# Patient Record
Sex: Female | Born: 1970
Health system: Southern US, Community
[De-identification: ages and names within clinical notes are randomized; demographics above are authoritative.]

## PROBLEM LIST (undated history)

## (undated) ENCOUNTER — Inpatient Hospital Stay (HOSPITAL_COMMUNITY): Payer: Self-pay

## (undated) DIAGNOSIS — K222 Esophageal obstruction: Secondary | ICD-10-CM

## (undated) DIAGNOSIS — T4145XA Adverse effect of unspecified anesthetic, initial encounter: Secondary | ICD-10-CM

## (undated) DIAGNOSIS — T8859XA Other complications of anesthesia, initial encounter: Secondary | ICD-10-CM

## (undated) HISTORY — PX: ESOPHAGEAL DILATION: SHX303

---

## 1999-01-19 ENCOUNTER — Other Ambulatory Visit: Admission: RE | Admit: 1999-01-19 | Discharge: 1999-01-19 | Payer: Self-pay | Admitting: Obstetrics & Gynecology

## 2000-01-22 ENCOUNTER — Other Ambulatory Visit: Admission: RE | Admit: 2000-01-22 | Discharge: 2000-01-22 | Payer: Self-pay | Admitting: Obstetrics & Gynecology

## 2000-05-11 ENCOUNTER — Other Ambulatory Visit: Admission: RE | Admit: 2000-05-11 | Discharge: 2000-05-11 | Payer: Self-pay | Admitting: Obstetrics & Gynecology

## 2001-10-19 ENCOUNTER — Other Ambulatory Visit: Admission: RE | Admit: 2001-10-19 | Discharge: 2001-10-19 | Payer: Self-pay | Admitting: Gynecology

## 2002-11-09 ENCOUNTER — Other Ambulatory Visit: Admission: RE | Admit: 2002-11-09 | Discharge: 2002-11-09 | Payer: Self-pay | Admitting: Gynecology

## 2004-05-01 ENCOUNTER — Other Ambulatory Visit: Admission: RE | Admit: 2004-05-01 | Discharge: 2004-05-01 | Payer: Self-pay | Admitting: Obstetrics and Gynecology

## 2004-12-08 ENCOUNTER — Other Ambulatory Visit: Admission: RE | Admit: 2004-12-08 | Discharge: 2004-12-08 | Payer: Self-pay | Admitting: Gynecology

## 2007-01-03 ENCOUNTER — Ambulatory Visit: Payer: Self-pay | Admitting: Gastroenterology

## 2007-01-03 LAB — CONVERTED CEMR LAB
ALT: 14 units/L (ref 0–40)
AST: 20 units/L (ref 0–37)
Albumin: 3.8 g/dL (ref 3.5–5.2)
Alkaline Phosphatase: 48 units/L (ref 39–117)
BUN: 8 mg/dL (ref 6–23)
Basophils Absolute: 0 10*3/uL (ref 0.0–0.1)
Basophils Relative: 0.6 % (ref 0.0–1.0)
Bilirubin, Direct: 0.1 mg/dL (ref 0.0–0.3)
Calcium: 8.9 mg/dL (ref 8.4–10.5)
Chloride: 110 meq/L (ref 96–112)
Folate: 9.9 ng/mL
GFR calc Af Amer: 124 mL/min
GFR calc non Af Amer: 102 mL/min
HCT: 38.4 % (ref 36.0–46.0)
Hemoglobin: 13 g/dL (ref 12.0–15.0)
MCHC: 33.9 g/dL (ref 30.0–36.0)
Monocytes Relative: 5.5 % (ref 3.0–11.0)
RBC: 4.46 M/uL (ref 3.87–5.11)
RDW: 13.2 % (ref 11.5–14.6)
Tissue Transglutaminase Ab, IgA: <3 units (ref <5)
Vitamin B-12: 315 pg/mL (ref 211–911)

## 2007-01-04 ENCOUNTER — Ambulatory Visit: Payer: Self-pay | Admitting: Gastroenterology

## 2007-06-21 ENCOUNTER — Inpatient Hospital Stay (HOSPITAL_COMMUNITY): Admission: AD | Admit: 2007-06-21 | Discharge: 2007-06-21 | Payer: Self-pay | Admitting: Obstetrics and Gynecology

## 2008-08-15 ENCOUNTER — Inpatient Hospital Stay (HOSPITAL_COMMUNITY): Admission: AD | Admit: 2008-08-15 | Discharge: 2008-08-17 | Payer: Self-pay | Admitting: Obstetrics and Gynecology

## 2009-09-27 LAB — HM MAMMOGRAPHY

## 2010-09-19 ENCOUNTER — Inpatient Hospital Stay (HOSPITAL_COMMUNITY)
Admission: AD | Admit: 2010-09-19 | Discharge: 2010-09-19 | Payer: Self-pay | Source: Home / Self Care | Attending: Obstetrics and Gynecology | Admitting: Obstetrics and Gynecology
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
## 2011-04-19 ENCOUNTER — Inpatient Hospital Stay (HOSPITAL_COMMUNITY)
Admission: RE | Admit: 2011-04-19 | Discharge: 2011-04-21 | DRG: 371 | Disposition: A | Discharge status: Home / Self Care | Payer: BC Managed Care – PPO | Source: Ambulatory Visit | Attending: Obstetrics and Gynecology | Admitting: Obstetrics and Gynecology

## 2011-04-19 ENCOUNTER — Encounter (HOSPITAL_COMMUNITY): Payer: Self-pay | Admitting: Anesthesiology

## 2011-04-19 ENCOUNTER — Encounter (HOSPITAL_COMMUNITY): Payer: Self-pay

## 2011-04-19 ENCOUNTER — Encounter (HOSPITAL_COMMUNITY)
Admission: RE | Disposition: A | Discharge status: Home / Self Care | Payer: Self-pay | Source: Ambulatory Visit | Attending: Obstetrics and Gynecology

## 2011-04-19 ENCOUNTER — Inpatient Hospital Stay (HOSPITAL_COMMUNITY): Payer: BC Managed Care – PPO | Admitting: Anesthesiology

## 2011-04-19 ENCOUNTER — Other Ambulatory Visit: Payer: Self-pay | Admitting: Obstetrics and Gynecology

## 2011-04-19 DIAGNOSIS — O09529 Supervision of elderly multigravida, unspecified trimester: Secondary | ICD-10-CM | POA: Diagnosis present

## 2011-04-19 DIAGNOSIS — O328XX0 Maternal care for other malpresentation of fetus, not applicable or unspecified: Principal | ICD-10-CM | POA: Diagnosis present

## 2011-04-19 DIAGNOSIS — Z3490 Encounter for supervision of normal pregnancy, unspecified, unspecified trimester: Secondary | ICD-10-CM

## 2011-04-19 HISTORY — DX: Other complications of anesthesia, initial encounter: T88.59XA

## 2011-04-19 HISTORY — DX: Adverse effect of unspecified anesthetic, initial encounter: T41.45XA

## 2011-04-19 HISTORY — DX: Esophageal obstruction: K22.2

## 2011-04-19 LAB — CBC
Hemoglobin: 12.8 g/dL (ref 12.0–15.0)
MCH: 28.8 pg (ref 26.0–34.0)
Platelets: 216 10*3/uL (ref 150–400)
RBC: 4.44 MIL/uL (ref 3.87–5.11)
WBC: 13 10*3/uL — ABNORMAL HIGH (ref 4.0–10.5)

## 2011-04-19 LAB — ABO/RH: RH Type: NEGATIVE

## 2011-04-19 LAB — HIV ANTIBODY (ROUTINE TESTING W REFLEX): HIV: NONREACTIVE

## 2011-04-19 LAB — TYPE AND SCREEN: Antibody Screen: POSITIVE

## 2011-04-19 LAB — RPR: RPR: NONREACTIVE

## 2011-04-19 LAB — STREP B DNA PROBE: GBS: NEGATIVE

## 2011-04-19 SURGERY — Surgical Case
Anesthesia: Regional | Wound class: Clean Contaminated

## 2011-04-19 MED ORDER — FENTANYL CITRATE 0.05 MG/ML IJ SOLN
INTRAMUSCULAR | Status: AC
  Filled 2011-04-19: qty 2

## 2011-04-19 MED ORDER — PHENYLEPHRINE 40 MCG/ML (10ML) SYRINGE FOR IV PUSH (FOR BLOOD PRESSURE SUPPORT)
80.0000 ug | PREFILLED_SYRINGE | INTRAVENOUS | Status: DC | PRN
  Filled 2011-04-19 (×2): qty 5

## 2011-04-19 MED ORDER — METOCLOPRAMIDE HCL 5 MG/ML IJ SOLN: 10.0000 mg | Freq: Three times a day (TID) | INTRAMUSCULAR | Status: DC | PRN

## 2011-04-19 MED ORDER — ONDANSETRON HCL 4 MG/2ML IJ SOLN: 4.0000 mg | Freq: Four times a day (QID) | INTRAMUSCULAR | Status: DC | PRN

## 2011-04-19 MED ORDER — CEFAZOLIN SODIUM 1-5 GM-% IV SOLN
INTRAVENOUS | Status: AC
  Filled 2011-04-19: qty 50

## 2011-04-19 MED ORDER — CEFAZOLIN SODIUM 1-5 GM-% IV SOLN
1.0000 g | Freq: Three times a day (TID) | INTRAVENOUS | Status: AC
  Administered 2011-04-19 – 2011-04-20 (×2): 1 g via INTRAVENOUS
  Filled 2011-04-19 (×2): qty 50

## 2011-04-19 MED ORDER — SODIUM CHLORIDE 0.9 % IV SOLN
2.0000 g | Freq: Once | INTRAVENOUS | Status: DC
  Filled 2011-04-19: qty 2000

## 2011-04-19 MED ORDER — CEFAZOLIN SODIUM 1-5 GM-% IV SOLN
INTRAVENOUS | Status: DC | PRN
  Administered 2011-04-19: 1 g via INTRAVENOUS

## 2011-04-19 MED ORDER — KETOROLAC TROMETHAMINE 60 MG/2ML IM SOLN
60.0000 mg | Freq: Once | INTRAMUSCULAR | Status: AC | PRN
  Filled 2011-04-19: qty 2

## 2011-04-19 MED ORDER — DIPHENHYDRAMINE HCL 50 MG/ML IJ SOLN: 12.5000 mg | INTRAMUSCULAR | Status: DC | PRN

## 2011-04-19 MED ORDER — CITRIC ACID-SODIUM CITRATE 334-500 MG/5ML PO SOLN
30.0000 mL | ORAL | Status: DC | PRN
  Filled 2011-04-19: qty 15

## 2011-04-19 MED ORDER — EPHEDRINE SULFATE 50 MG/ML IJ SOLN
INTRAMUSCULAR | Status: DC | PRN
  Administered 2011-04-19: 10 mg via INTRAVENOUS
  Administered 2011-04-19: 15 mg via INTRAVENOUS
  Administered 2011-04-19: 5 mg via INTRAVENOUS

## 2011-04-19 MED ORDER — EPHEDRINE 5 MG/ML INJ
10.0000 mg | INTRAVENOUS | Status: DC | PRN
  Filled 2011-04-19 (×2): qty 4

## 2011-04-19 MED ORDER — ONDANSETRON HCL 4 MG/2ML IJ SOLN
INTRAMUSCULAR | Status: DC | PRN
  Administered 2011-04-19: 4 mg via INTRAVENOUS

## 2011-04-19 MED ORDER — NALBUPHINE SYRINGE 5 MG/0.5 ML
5.0000 mg | INJECTION | INTRAMUSCULAR | Status: AC | PRN
  Filled 2011-04-19: qty 1

## 2011-04-19 MED ORDER — LACTATED RINGERS IV SOLN: 500.0000 mL | Freq: Once | INTRAVENOUS | Status: DC

## 2011-04-19 MED ORDER — SODIUM CHLORIDE 0.9 % IV SOLN
1.0000 ug/kg/h | INTRAVENOUS | Status: DC | PRN
  Filled 2011-04-19: qty 2.5

## 2011-04-19 MED ORDER — FENTANYL CITRATE 0.05 MG/ML IJ SOLN
INTRAMUSCULAR | Status: DC | PRN
  Administered 2011-04-19: 15 ug via INTRATHECAL

## 2011-04-19 MED ORDER — LACTATED RINGERS IV SOLN
500.0000 mL | INTRAVENOUS | Status: AC | PRN
  Administered 2011-04-19: 1000 mL via INTRAVENOUS

## 2011-04-19 MED ORDER — SIMETHICONE 80 MG PO CHEW: 80.0000 mg | CHEWABLE_TABLET | ORAL | Status: DC | PRN

## 2011-04-19 MED ORDER — MENTHOL 3 MG MT LOZG: 1.0000 | LOZENGE | OROMUCOSAL | Status: DC | PRN

## 2011-04-19 MED ORDER — MEPERIDINE HCL 25 MG/ML IJ SOLN: 6.2500 mg | INTRAMUSCULAR | Status: DC | PRN

## 2011-04-19 MED ORDER — SCOPOLAMINE 1 MG/3DAYS TD PT72
1.0000 | MEDICATED_PATCH | Freq: Once | TRANSDERMAL | Status: DC
  Filled 2011-04-19: qty 1

## 2011-04-19 MED ORDER — SCOPOLAMINE 1 MG/3DAYS TD PT72
MEDICATED_PATCH | TRANSDERMAL | Status: AC
  Filled 2011-04-19: qty 1

## 2011-04-19 MED ORDER — ONDANSETRON HCL 4 MG PO TABS: 4.0000 mg | ORAL_TABLET | ORAL | Status: DC | PRN

## 2011-04-19 MED ORDER — SODIUM CHLORIDE 0.9 % IJ SOLN: 3.0000 mL | INTRAMUSCULAR | Status: DC | PRN

## 2011-04-19 MED ORDER — FLEET ENEMA 7-19 GM/118ML RE ENEM: 1.0000 | ENEMA | RECTAL | Status: DC | PRN

## 2011-04-19 MED ORDER — WITCH HAZEL-GLYCERIN EX PADS: MEDICATED_PAD | CUTANEOUS | Status: DC | PRN

## 2011-04-19 MED ORDER — FENTANYL 2.5 MCG/ML BUPIVACAINE 1/10 % EPIDURAL INFUSION (WH - ANES)
14.0000 mL/h | INTRAMUSCULAR | Status: DC
  Filled 2011-04-19: qty 60

## 2011-04-19 MED ORDER — ZOLPIDEM TARTRATE 5 MG PO TABS: 5.0000 mg | ORAL_TABLET | Freq: Every evening | ORAL | Status: DC | PRN

## 2011-04-19 MED ORDER — LIDOCAINE HCL (PF) 1 % IJ SOLN
30.0000 mL | INTRAMUSCULAR | Status: DC | PRN
  Filled 2011-04-19: qty 30

## 2011-04-19 MED ORDER — OXYTOCIN 20 UNITS IN LACTATED RINGERS INFUSION - SIMPLE: 125.0000 mL/h | Freq: Once | INTRAVENOUS | Status: DC

## 2011-04-19 MED ORDER — LACTATED RINGERS IV SOLN
INTRAVENOUS | Status: DC
  Administered 2011-04-19 (×2): via INTRAVENOUS

## 2011-04-19 MED ORDER — OXYTOCIN 10 UNIT/ML IJ SOLN
INTRAMUSCULAR | Status: DC | PRN
  Administered 2011-04-19: 20 [IU] via INTRAMUSCULAR

## 2011-04-19 MED ORDER — EPHEDRINE 5 MG/ML INJ
10.0000 mg | INTRAVENOUS | Status: DC | PRN
  Filled 2011-04-19: qty 4

## 2011-04-19 MED ORDER — SENNOSIDES-DOCUSATE SODIUM 8.6-50 MG PO TABS
1.0000 | ORAL_TABLET | Freq: Every day | ORAL | Status: DC
  Administered 2011-04-19 – 2011-04-20 (×2): 1 via ORAL

## 2011-04-19 MED ORDER — OXYCODONE-ACETAMINOPHEN 5-325 MG PO TABS
1.0000 | ORAL_TABLET | ORAL | Status: DC | PRN
  Administered 2011-04-20 (×2): 1 via ORAL
  Administered 2011-04-21 (×2): 2 via ORAL
  Filled 2011-04-19 (×6): qty 1

## 2011-04-19 MED ORDER — NALOXONE HCL 0.4 MG/ML IJ SOLN: 0.4000 mg | INTRAMUSCULAR | Status: DC | PRN

## 2011-04-19 MED ORDER — ONDANSETRON HCL 4 MG/2ML IJ SOLN
4.0000 mg | INTRAMUSCULAR | Status: DC | PRN
  Administered 2011-04-19: 4 mg via INTRAVENOUS

## 2011-04-19 MED ORDER — OXYTOCIN 10 UNIT/ML IJ SOLN
INTRAMUSCULAR | Status: AC
  Filled 2011-04-19: qty 2

## 2011-04-19 MED ORDER — OXYCODONE-ACETAMINOPHEN 5-325 MG PO TABS: 2.0000 | ORAL_TABLET | ORAL | Status: DC | PRN

## 2011-04-19 MED ORDER — ACETAMINOPHEN 325 MG PO TABS: 650.0000 mg | ORAL_TABLET | ORAL | Status: DC | PRN

## 2011-04-19 MED ORDER — ONDANSETRON HCL 4 MG/2ML IJ SOLN
INTRAMUSCULAR | Status: AC
  Filled 2011-04-19: qty 2

## 2011-04-19 MED ORDER — MORPHINE SULFATE (PF) 0.5 MG/ML IJ SOLN
INTRAMUSCULAR | Status: DC | PRN
  Administered 2011-04-19: .1 mg via INTRATHECAL

## 2011-04-19 MED ORDER — IBUPROFEN 100 MG/5ML PO SUSP
600.0000 mg | Freq: Four times a day (QID) | ORAL | Status: DC | PRN
Start: 2011-04-19 — End: 2011-04-21
  Administered 2011-04-20 (×4): 600 mg via ORAL
  Filled 2011-04-19: qty 30

## 2011-04-19 MED ORDER — TETANUS-DIPHTH-ACELL PERTUSSIS 5-2.5-18.5 LF-MCG/0.5 IM SUSP: 0.5000 mL | Freq: Once | INTRAMUSCULAR | Status: DC

## 2011-04-19 MED ORDER — KETOROLAC TROMETHAMINE 30 MG/ML IJ SOLN
30.0000 mg | Freq: Four times a day (QID) | INTRAMUSCULAR | Status: AC | PRN
  Administered 2011-04-19: 30 mg via INTRAVENOUS
  Filled 2011-04-19: qty 1

## 2011-04-19 MED ORDER — OXYTOCIN 20 UNITS IN LACTATED RINGERS INFUSION - SIMPLE: 125.0000 mL/h | INTRAVENOUS | Status: DC

## 2011-04-19 MED ORDER — SIMETHICONE 80 MG PO CHEW
80.0000 mg | CHEWABLE_TABLET | Freq: Three times a day (TID) | ORAL | Status: DC
  Administered 2011-04-19 – 2011-04-21 (×7): 80 mg via ORAL

## 2011-04-19 MED ORDER — IBUPROFEN 600 MG PO TABS: 600.0000 mg | ORAL_TABLET | Freq: Four times a day (QID) | ORAL | Status: DC | PRN

## 2011-04-19 MED ORDER — DIPHENHYDRAMINE HCL 25 MG PO CAPS: 25.0000 mg | ORAL_CAPSULE | Freq: Four times a day (QID) | ORAL | Status: DC | PRN

## 2011-04-19 MED ORDER — DIPHENHYDRAMINE HCL 50 MG/ML IJ SOLN: 25.0000 mg | INTRAMUSCULAR | Status: DC | PRN

## 2011-04-19 MED ORDER — KETOROLAC TROMETHAMINE 30 MG/ML IJ SOLN: 30.0000 mg | Freq: Four times a day (QID) | INTRAMUSCULAR | Status: AC | PRN

## 2011-04-19 MED ORDER — DIPHENHYDRAMINE HCL 25 MG PO CAPS: 25.0000 mg | ORAL_CAPSULE | ORAL | Status: DC | PRN

## 2011-04-19 MED ORDER — ONDANSETRON HCL 4 MG/2ML IJ SOLN
4.0000 mg | Freq: Three times a day (TID) | INTRAMUSCULAR | Status: DC | PRN
  Filled 2011-04-19: qty 2

## 2011-04-19 MED ORDER — PHENYLEPHRINE 40 MCG/ML (10ML) SYRINGE FOR IV PUSH (FOR BLOOD PRESSURE SUPPORT)
80.0000 ug | PREFILLED_SYRINGE | INTRAVENOUS | Status: DC | PRN
  Filled 2011-04-19: qty 5

## 2011-04-19 MED ORDER — MORPHINE SULFATE 0.5 MG/ML IJ SOLN
INTRAMUSCULAR | Status: AC
  Filled 2011-04-19: qty 10

## 2011-04-19 MED ORDER — IBUPROFEN 600 MG PO TABS
600.0000 mg | ORAL_TABLET | Freq: Four times a day (QID) | ORAL | Status: DC
  Administered 2011-04-20 – 2011-04-21 (×3): 600 mg via ORAL
  Filled 2011-04-19 (×4): qty 1

## 2011-04-19 MED ORDER — LACTATED RINGERS IV SOLN
INTRAVENOUS | Status: DC
  Administered 2011-04-19 – 2011-04-20 (×2): via INTRAVENOUS

## 2011-04-19 MED ORDER — PRENATAL PLUS 27-1 MG PO TABS
1.0000 | ORAL_TABLET | Freq: Every day | ORAL | Status: DC
  Administered 2011-04-21: 1 via ORAL
  Filled 2011-04-19: qty 1

## 2011-04-19 MED ORDER — BUPIVACAINE HCL 0.75 % IJ SOLN
INTRAMUSCULAR | Status: DC | PRN
  Administered 2011-04-19: 1.5 mL via INTRATHECAL

## 2011-04-19 SURGICAL SUPPLY — 27 items
CLOTH BEACON ORANGE TIMEOUT ST (SAFETY) ×2 IMPLANT
CONTAINER PREFILL 10% NBF 15ML (MISCELLANEOUS) IMPLANT
DRAPE UTILITY XL STRL (DRAPES) ×2 IMPLANT
DRESSING TELFA 8X3 (GAUZE/BANDAGES/DRESSINGS) IMPLANT
ELECT REM PT RETURN 9FT ADLT (ELECTROSURGICAL) ×2
ELECTRODE REM PT RTRN 9FT ADLT (ELECTROSURGICAL) ×1 IMPLANT
EXTRACTOR VACUUM M CUP 4 TUBE (SUCTIONS) IMPLANT
GAUZE SPONGE 4X4 12PLY STRL LF (GAUZE/BANDAGES/DRESSINGS) ×4 IMPLANT
GLOVE BIO SURGEON STRL SZ8 (GLOVE) ×4 IMPLANT
GOWN BRE IMP SLV AUR LG STRL (GOWN DISPOSABLE) ×2 IMPLANT
GOWN BRE IMP SLV AUR XL STRL (GOWN DISPOSABLE) ×2 IMPLANT
KIT ABG SYR 3ML LUER SLIP (SYRINGE) ×2 IMPLANT
NDL HYPO 25X5/8 SAFETYGLIDE (NEEDLE) ×1 IMPLANT
NEEDLE HYPO 25X5/8 SAFETYGLIDE (NEEDLE) ×2 IMPLANT
NS IRRIG 1000ML POUR BTL (IV SOLUTION) ×2 IMPLANT
PACK C SECTION WH (CUSTOM PROCEDURE TRAY) ×2 IMPLANT
PAD ABD 7.5X8 STRL (GAUZE/BANDAGES/DRESSINGS) IMPLANT
SLEEVE SCD COMPRESS KNEE MED (MISCELLANEOUS) IMPLANT
STAPLER VISISTAT 35W (STAPLE) IMPLANT
STRIP CLOSURE SKIN 1/4X4 (GAUZE/BANDAGES/DRESSINGS) ×2 IMPLANT
SUT MNCRL 0 VIOLET CTX 36 (SUTURE) ×4 IMPLANT
SUT MONOCRYL 0 CTX 36 (SUTURE) ×4
SUT PDS AB 0 CTX 60 (SUTURE) ×2 IMPLANT
SUT PLAIN 0 NONE (SUTURE) IMPLANT
TOWEL OR 17X24 6PK STRL BLUE (TOWEL DISPOSABLE) ×4 IMPLANT
TRAY FOLEY CATH 14FR (SET/KITS/TRAYS/PACK) ×2 IMPLANT
WATER STERILE IRR 1000ML POUR (IV SOLUTION) ×2 IMPLANT

## 2011-04-19 NOTE — Brief Op Note (Signed)
04/19/2011  11:20 AM  PATIENT:  [name]  40 y.o. female with compound presentation.  PRE-OPERATIVE DIAGNOSIS:  compound presentation  POST-OPERATIVE DIAGNOSIS:  compound presentation  PROCEDURE:  Procedure(s): CESAREAN SECTION  SURGEON:  Surgeon(s): Roselle Locus II  PHYSICIAN ASSISTANT:   ASSISTANTS: none   ANESTHESIA:   spinal  ESTIMATED BLOOD LOSS: 500cc  BLOOD ADMINISTERED:none  DRAINS: Urinary Catheter (Foley)   LOCAL MEDICATIONS USED:  NONE  SPECIMEN:  Source of Specimen:  placenta  DISPOSITION OF SPECIMEN:  PATHOLOGY  COUNTS:  YES  TOURNIQUET:  * No tourniquets in log *  DICTATION #: [id number]  PLAN OF CARE: post partum care  PATIENT DISPOSITION:  PACU - hemodynamically stable.   Delay start of Pharmacological VTE agent (>24hrs) due to surgical blood loss or risk of bleeding:  not applicable

## 2011-04-19 NOTE — Anesthesia Procedure Notes (Addendum)
Spinal Block  Patient location during procedure: OR Start time: 04/19/2011 10:47 AM Staffing Anesthesiologist: Joachim Carton L. Performed by: anesthesiologist  Preanesthetic Checklist Completed: patient identified, site marked, surgical consent, pre-op evaluation, timeout performed, IV checked, risks and benefits discussed and monitors and equipment checked Spinal Block Patient position: right lateral decubitus Prep: site prepped and draped and DuraPrep Patient monitoring: heart rate, cardiac monitor, continuous pulse ox and blood pressure Approach: midline Location: L3-4 Injection technique: single-shot Needle Needle type: Sprotte  Needle gauge: 24 G Needle length: 5 cm Assessment Sensory level: T4

## 2011-04-19 NOTE — Transfer of Care (Signed)
Immediate Anesthesia Transfer of Care Note  Patient: [name]  Procedure(s) Performed:  CESAREAN SECTION - Primary Ceserean section delivery of baby  boy apgar 7/8  cord ph  7.06  Patient Location: PACU  Anesthesia Type: Spinal  Level of Consciousness: awake, alert , oriented and patient cooperative  Airway & Oxygen Therapy: Patient connected to nasal cannula oxygen  Post-op Assessment: Report given to PACU RN and Post -op Vital signs reviewed and stable  Post vital signs: Reviewed and stable  Complications: No apparent anesthesia complications2

## 2011-04-19 NOTE — Op Note (Signed)
[name], [name]               ACCOUNT NO.:  [id number]  MEDICAL RECORD NO.:  [id number]  LOCATION:  9102                          FACILITY:  [facility]  PHYSICIAN:  Guy Sandifer. Henderson Cloud, M.D. DATE OF BIRTH:  [birth year]  DATE OF PROCEDURE:  04/19/2011 DATE OF DISCHARGE:                              OPERATIVE REPORT   PREOPERATIVE DIAGNOSES: 1. Intrauterine pregnancy at 39-2/7th weeks. 2. Compound presentation.  POSTOPERATIVE DIAGNOSES: 1. Intrauterine pregnancy at 39-2/7th weeks. 2. Compound presentation.  PROCEDURE:  Low-transverse cesarean section, emergent.  SURGEON:  Guy Sandifer. Henderson Cloud, MD  ANESTHESIA:  Spinal, Dana Allan, MD  ESTIMATED BLOOD LOSS:  500 mL.  SPECIMENS:  Placenta to pathology.  FINDINGS:  Viable female infant, Apgars of 7 and 8 at 1 and 5 minutes respectively.  Birth weight pending.  Arterial cord pH 7.06.  INDICATIONS AND CONSENT:  This patient is a 40 year old married white female G4, P1 who had artificial rupture of membranes.  At that point, a hand can just be felt at the 4 o'clock position by the vertex.  Cervix is about 4 cm and dilated at that point.  About 30 minutes, more of the hand was palpable and subsequent to that the hand crosses the entire top of the head.  The patient is making rapid progress to 9 cm.  Discussion with the patient and husband was undertaken.  Possibility of damage to the arm with progressive labor was discussed.  In fact, I could not absolutely be certain.  There could not be trauma to the arm as reviewed.  Options were reviewed and the patient requested cesarean section.  The patient then taken to the operating room for emergent cesarean section.  DESCRIPTION OF PROCEDURE:  The patient was taken to operating room where fetal heart tones were reactive.  She has a spinal anesthetic placed. Time-out was undertaken.  She was placed in the dorsal supine position with 15-degree left lateral wedge.  She was then prepped.   Foley catheter was placed and the bladder was drained.  She was draped in a sterile fashion.  After testing for adequate spinal anesthesia, skin was entered through a Pfannenstiel incision and dissection was carried out in layers to the peritoneum.  The peritoneum was incised and extended superiorly and inferiorly.  Vesicouterine peritoneum was taken down cephalad laterally.  The bladder flap was felt and the bladder blade was placed.  The uterus was then incised in a low-transverse manner.  The uterine cavity was entered bluntly with a hemostat.  Uterine incision was extended cephalad laterally with fingers.  Vertex then delivered without difficulty.  Baby was delivered.  Cord was clamped and cut. Baby was handed away to awaiting pediatrics team.  Placenta was manually delivered and sent to pathology.  Uterus was closed in 2 running locking imbricating layers of 0-Monocryl suture which achieved good hemostasis. Ovaries were normal bilaterally.  There was a 1-cm subserosal fibroid on the right posterior fundus.  Anterior peritoneum was closed in a running fashion with 0-Monocryl suture.  The fascia was closed in a running fashion with a 0-looped PDS suture and the skin was closed with clips. All sponge, instrument, and needle counts were correct.  The patient was transferred to the recovery room in stable condition.     Guy Sandifer Henderson Cloud, M.D.     JET/MEDQ  D:  04/19/2011  T:  04/19/2011  Job:  [id number]

## 2011-04-19 NOTE — Consult Note (Signed)
Called to attend primary C/section at [redacted] wks EGA after uncomplicated pregnancy.  Mother was induced and progressing in labor until baby's right arm presented. AROM with clear fluid @ 0905, no maternal fever or fetal distress.  Infant hypotonic with little spontaneous movement of extremities at birth, but good HR and respiratory effort initially.  Right arm appeared normal without palpable crepitance or excessive bruising or edema.  Infant was slow to pink up and was given BBO2 for about 1 minute at 5 - 6 minutes of age.  Color improved and he was shown to mother, then taken to CN.  He began grunting en route and was slightly cyanotic on arrival to CN.  Pulse oximetry showed sats < 80% so BBO2 was begun.  Further care per Dr. Christean Leaf Peds (neo will follow pending resolution of distress or  possible transfer to NICU.  JWimmer,MD

## 2011-04-19 NOTE — H&P (Signed)
[name] is a 40 y.o. female presenting for induction of labor History OB History    Grav Para Term Preterm Abortions TAB SAB Ect Mult Living   4 1 1  0 2 0 2 0 0 1     Past Medical History  Diagnosis Date  . Esophageal stricture   . Complication of anesthesia     pt reports waking up during surgery  . Normal pregnancy 04/19/2011   Past Surgical History  Procedure Date  . Esophageal dilation    Family History: family history is not on file. Social History:  reports that she has never smoked. She has never used smokeless tobacco. She reports that she does not drink alcohol. Her drug history not on file.  Review of Systems  Constitutional: Negative.   Eyes: Negative for blurred vision.  Respiratory: Negative.   Cardiovascular: Negative.   Gastrointestinal: Negative for abdominal pain.  Genitourinary: Negative.     Dilation: 4 Effacement (%): 90 Station: -2 Exam by:: Dr Henderson Cloud Blood pressure 106/76, pulse 88, temperature 98.3 F (36.8 C), temperature source Oral, resp. rate 20. Maternal Exam:  Uterine Assessment: Contraction strength is mild.  Contraction frequency is irregular.   Abdomen: Fetal presentation: vertex  Introitus: Normal vulva.   Fetal Exam Fetal Monitor Review: Baseline rate: 130s.  Variability: moderate (6-25 bpm).   Pattern: accelerations present.    Fetal State Assessment: Category I - tracings are normal.     Physical Exam  Constitutional: She appears well-developed and well-nourished.  Cardiovascular: Normal rate and regular rhythm.   Respiratory: Effort normal and breath sounds normal.  GI: There is no tenderness.  Genitourinary: There is no lesion on the right labia. There is no lesion on the left labia.  Neurological: She has normal reflexes.    Prenatal labs: ABO, Rh:   Antibody: Positive (07/23 0000) Rubella:   RPR: Nonreactive (07/23 0000)  HBsAg: Negative (07/23 0000)  HIV: Non-reactive (07/23 0000)  GBS: Negative  (07/23 0000)   Assessment/Plan: 40 yo MWF G4 P1 @ 39 2/7 weeks, Hx of HSV with no prodromal sxs or vulvar lesions.  AROM with prob hand at about 4:00 position and vtx -2.  Cx check 20-30 min.  FHT reactive  Reine Bristow II,Torry Adamczak E 04/19/2011, 9:17 AM

## 2011-04-19 NOTE — Anesthesia Preprocedure Evaluation (Addendum)
Anesthesia Evaluation  Name, MR# and DOB Patient awake  General Assessment Comment  Reviewed: Allergy & Precautions, H&P  and Patient's Chart, lab work & pertinent test results  History of Anesthesia Complications (+) AWARENESS UNDER ANESTHESIA  Airway       Dental   Pulmonaryneg pulmonary ROS        Cardiovascular    Neuro/PsychNegative Neurological ROS Negative Psych ROS  GI/Hepatic/Renal negative Liver ROS, and negative Renal ROS (+)     History of esophageal stricture s/p dilation   Endo/Other  Negative Endocrine ROS (+)   Abdominal   Musculoskeletal  Hematology negative hematology ROS (+)   Peds  Reproductive/Obstetrics (+) Pregnancy   Anesthesia Other Findings             Anesthesia Physical Anesthesia Plan  ASA: II and Emergent  Anesthesia Plan: Spinal   Post-op Pain Management:    Induction:   Airway Management Planned:   Additional Equipment:   Intra-op Plan:   Post-operative Plan:   Informed Consent: I have reviewed the patients History and Physical, chart, labs and discussed the procedure including the risks, benefits and alternatives for the proposed anesthesia with the patient or authorized representative who has indicated his/her understanding and acceptance.     Plan Discussed with: CRNA and Surgeon  Anesthesia Plan Comments:         Anesthesia Quick Evaluation

## 2011-04-20 LAB — CBC
HCT: 30.8 % — ABNORMAL LOW (ref 36.0–46.0)
MCH: 28.9 pg (ref 26.0–34.0)
MCV: 86.3 fL (ref 78.0–100.0)
Platelets: 183 10*3/uL (ref 150–400)
RDW: 15.6 % — ABNORMAL HIGH (ref 11.5–15.5)
WBC: 13.2 10*3/uL — ABNORMAL HIGH (ref 4.0–10.5)

## 2011-04-20 NOTE — Progress Notes (Signed)
SW consult received for "babies who have drug screen sent." SW reviewed babies chart and there has not been any drug screens ordered.  Therefore, SW has screened out this referral as an error.  SW will only see if appropriate consult is ordered. 

## 2011-04-20 NOTE — Anesthesia Postprocedure Evaluation (Signed)
  Anesthesia Post-op Note  Patient: [name]  Procedure(s) Performed:  CESAREAN SECTION - Primary Ceserean section delivery of baby  boy apgar 7/8  cord ph  7.06 - Baby,mom,dad banded by Nevada Crane RN & Colin Mulders RN   No anesthesia complications.  Level of consciousness: alert. Cardiopulmonary status stable.  No follow-up care or observation required.  Trayson Stitely L. Rodman Pickle, MD

## 2011-04-20 NOTE — Progress Notes (Signed)
Subjective: Postpartum Day 1: Cesarean Delivery Patient reports tolerating PO and + flatus.    Objective: Vital signs in last 24 hours: Temp:  [97.5 F (36.4 C)-99.6 F (37.6 C)] 98.5 F (36.9 C) (07/24 0605) Pulse Rate:  [61-96] 66  (07/24 0605) Resp:  [13-53] 18  (07/24 0605) BP: (102-118)/(56-78) 102/62 mmHg (07/24 0605) SpO2:  [96 %-100 %] 98 % (07/24 0605) Weight:  [78.926 kg (174 lb)] 174 lb (78.926 kg) (07/23 1056)  Physical Exam:  General: alert, cooperative and no distress Lochia: appropriate Uterine Fundus: firm Abd drsg CDI Foley discontinued , vding well DVT Evaluation: No evidence of DVT seen on physical exam.   Basename 04/20/11 0500 04/19/11 0809  HGB 10.3* 12.8  HCT 30.8* 37.8    Assessment/Plan: Status post Cesarean section. Doing well postoperatively.  Continue current care.  CURTIS,CAROL G 04/20/2011, 7:53 AM

## 2011-04-21 ENCOUNTER — Encounter (HOSPITAL_COMMUNITY): Payer: Self-pay

## 2011-04-21 MED ORDER — OXYCODONE-ACETAMINOPHEN 5-325 MG PO TABS: 1.0000 | ORAL_TABLET | ORAL | Status: AC | PRN

## 2011-04-21 MED ORDER — IBUPROFEN 100 MG/5ML PO SUSP: 600.0000 mg | Freq: Four times a day (QID) | ORAL | Status: DC | PRN

## 2011-04-21 NOTE — Progress Notes (Cosign Needed)
Subjective: Postpartum Day 2: Cesarean Delivery Patient reports tolerating PO, + flatus and no problems voiding.  Desires early discharge  Objective: Vital signs in last 24 hours: Temp:  [97.8 F (36.6 C)-98.4 F (36.9 C)] 97.8 F (36.6 C) (07/25 0551) Pulse Rate:  [62-81] 65  (07/25 0551) Resp:  [18-20] 18  (07/25 0551) BP: (96-102)/(59-64) 99/61 mmHg (07/25 0551) SpO2:  [98 %] 98 % (07/24 1009)  Physical Exam:  General: alert, cooperative and no distress Lochia: appropriate Uterine Fundus: firm Incision: healing well DVT Evaluation: No evidence of DVT seen on physical exam.   Basename 04/20/11 0500 04/19/11 0809  HGB 10.3* 12.8  HCT 30.8* 37.8    Assessment/Plan: Status post Cesarean section. Doing well postoperatively.  Discharge home with standard precautions and return to clinic in 2 days for staple removal.  Teresa Lemmerman G 04/21/2011, 7:48 AM

## 2011-04-21 NOTE — Discharge Summary (Signed)
Obstetric Discharge Summary Reason for Admission: onset of labor Prenatal Procedures: none Intrapartum Procedures: cesarean: low cervical, transverse Postpartum Procedures: none Complications-Operative and Postpartum: none  Hemoglobin  Date Value Range Status  04/20/2011 10.3* 12.0-15.0 (g/dL) Final     DELTA CHECK NOTED     REPEATED TO VERIFY     HCT  Date Value Range Status  04/20/2011 30.8* 36.0-46.0 (%) Final    Discharge Diagnoses: Term Pregnancy-delivered  Discharge Information: Date: 04/21/2011 Activity: pelvic rest Diet: routine Medications: PNV, Ibuprophen and Percocet Condition: stable Instructions: refer to practice specific booklet Discharge to: home RTO in 2 days for staple removal    Newborn Data: Live born  Information for the patient's newborn:  Khala, Tarte [[id number]]  female   Home with mother.  CURTIS,CAROL G 04/21/2011, 8:01 AM

## 2011-04-24 ENCOUNTER — Inpatient Hospital Stay (HOSPITAL_COMMUNITY): Admission: AD | Admit: 2011-04-24 | Payer: Self-pay | Source: Ambulatory Visit | Admitting: Obstetrics and Gynecology

## 2011-05-12 ENCOUNTER — Encounter (HOSPITAL_COMMUNITY): Payer: Self-pay | Admitting: Obstetrics and Gynecology

## 2011-05-29 LAB — HM PAP SMEAR

## 2011-06-29 LAB — CBC
HCT: 34.6 — ABNORMAL LOW
HCT: 38.5
Hemoglobin: 11.8 — ABNORMAL LOW
MCHC: 34
MCV: 89.2
MCV: 89.7
Platelets: 165
RBC: 4.31
RDW: 14.8
WBC: 11.4 — ABNORMAL HIGH

## 2011-07-08 LAB — RH IMMUNE GLOBULIN WORKUP (NOT WOMEN'S HOSP)

## 2011-12-03 ENCOUNTER — Other Ambulatory Visit: Payer: Self-pay

## 2012-02-04 ENCOUNTER — Ambulatory Visit (INDEPENDENT_AMBULATORY_CARE_PROVIDER_SITE_OTHER): Payer: BC Managed Care – PPO | Admitting: Endocrinology

## 2012-02-04 ENCOUNTER — Encounter: Payer: Self-pay | Admitting: Endocrinology

## 2012-02-04 VITALS — BP 120/80 | HR 66 | Temp 99.9°F | Ht 63.0 in | Wt 153.0 lb

## 2012-02-04 DIAGNOSIS — K222 Esophageal obstruction: Secondary | ICD-10-CM

## 2012-02-04 DIAGNOSIS — E039 Hypothyroidism, unspecified: Secondary | ICD-10-CM | POA: Insufficient documentation

## 2012-02-04 MED ORDER — LEVOTHYROXINE SODIUM 50 MCG PO TABS: 50.0000 ug | ORAL_TABLET | Freq: Every day | ORAL | Status: DC

## 2012-02-04 NOTE — Patient Instructions (Addendum)
Cc dr Marcelle Overlie i have sent a prescription to your pharmacy, for a thyroid hormone pill.   Please recheck the blood test in 1-2 months.  You will receive a letter with results. Every year, you should have a physical examination of the thyroid, and the blood test.  i would be happy to do this for you, or dr Marcelle Overlie would be happy to, or you could do in [place].

## 2012-02-04 NOTE — Progress Notes (Signed)
Subjective:    Patient ID: [name], female    DOB: 10/21/1970, 41 y.o.   MRN: [id number]  HPI Pt had a pregnancy in 2012 (G5P2--3 miscarriages).  The last time she knew, she had normal TFT in 2005.  She now has a few mos of moderate hair loss on the head, and assoc dry skin.  She says another pregnancy is possible.   Past Medical History  Diagnosis Date  . Esophageal stricture   . Complication of anesthesia     pt reports waking up during surgery  . Normal pregnancy 04/19/2011  . Postpartum care following cesarean delivery 04/19/2011    Past Surgical History  Procedure Date  . Esophageal dilation   . Cesarean section 04/19/2011    Procedure: CESAREAN SECTION;  Surgeon: Roselle Locus II;  Location: [facility];  Service: Gynecology;  Laterality: N/A;  Primary Ceserean section delivery of baby  boy apgar 7/8  cord ph  7.06 - Baby,mom,dad banded by Nevada Crane RN & Colin Mulders RN     History   Social History  . Marital Status: Married    Spouse Name: N/A    Number of Children: N/A  . Years of Education: N/A   Occupational History  . Not on file.   Social History Main Topics  . Smoking status: Never Smoker   . Smokeless tobacco: Never Used  . Alcohol Use: No  . Drug Use:   . Sexually Active: Yes   Other Topics Concern  . Not on file   Social History Narrative  . No narrative on file    Current Outpatient Prescriptions on File Prior to Visit  Medication Sig Dispense Refill  . ibuprofen (ADVIL,MOTRIN) 100 MG/5ML suspension Take 30 mLs (600 mg total) by mouth every 6 (six) hours as needed for pain.  473 mL  1  . prenatal vitamin w/FE, FA (PRENATAL 1 + 1) 27-1 MG TABS Take 1 tablet by mouth daily.          Allergies  Allergen Reactions  . Eggs Or Egg-Derived Products Swelling  . Latex Other (See Comments)    REACTION UNKNOWN    No family history on file. Father has hypothyroidism BP 120/80  Pulse 66  Temp(Src) 99.9 F (37.7 C) (Oral)  Ht 5\' 3"  (1.6 m)  Wt  153 lb (69.4 kg)  BMI 27.10 kg/m2  SpO2 96%  LMP 01/27/2012    Review of Systems denies depression, muscle weakness, fever, sob, rash, blurry vision, rhinorrhea, and chest pain.  She has headache, dizziness, decreased libido, cold intolerance, pain of fingers and toes, weight gain, urinary frequency, intermittent left arm numbness, easy bruising, and constipation.  menses have resumed.      Objective:   Physical Exam VS: see vs page GEN: no distress HEAD: head: no deformity eyes: no periorbital swelling, no proptosis external nose and ears are normal mouth: no lesion seen NECK: supple, thyroid is not enlarged CHEST WALL: no deformity LUNGS:  Clear to auscultation CV: reg rate and rhythm, no murmur ABD: abdomen is soft, nontender.  no hepatosplenomegaly.  not distended.  no hernia MUSCULOSKELETAL: muscle bulk and strength are grossly normal.  no obvious joint swelling.  gait is normal and steady EXTEMITIES: no deformity.  no ulcer on the feet.  feet are of normal color and temp.  no edema PULSES: dorsalis pedis intact bilat.  no carotid bruit NEURO:  cn 2-12 grossly intact.   readily moves all 4's.  sensation is intact  to touch on the feet SKIN:  Normal texture and temperature.  No rash or suspicious lesion is visible.   NODES:  None palpable at the neck PSYCH: alert, oriented x3.  Does not appear anxious nor depressed.  outside test results are reviewed: TSH=5      Assessment & Plan:  Hypothyroidism, new.  Mild Postpartum state.  This could exacerbate lymphocytic thyroiditis. Hair loss--could be autoimmune basis Weight gain, uncertain if thyroid-related

## 2013-07-27 ENCOUNTER — Ambulatory Visit (INDEPENDENT_AMBULATORY_CARE_PROVIDER_SITE_OTHER): Payer: BC Managed Care – PPO | Admitting: Endocrinology

## 2013-07-27 ENCOUNTER — Encounter: Payer: Self-pay | Admitting: Endocrinology

## 2013-07-27 VITALS — BP 112/78 | HR 80 | Temp 98.5°F | Resp 10 | Wt 157.9 lb

## 2013-07-27 DIAGNOSIS — E039 Hypothyroidism, unspecified: Secondary | ICD-10-CM

## 2013-07-27 MED ORDER — NEOMYCIN-POLYMYXIN-HC 1 % OT SOLN: 3.0000 [drp] | Freq: Four times a day (QID) | OTIC | Status: DC

## 2013-07-27 NOTE — Patient Instructions (Addendum)
blood tests are being requested for you today.  We'll contact you with results. i have sent a prescription to your pharmacy, for your ears.  Please call your regular doctor if the ear symptoms persist. Please return in 1 year.

## 2013-07-27 NOTE — Progress Notes (Signed)
  Subjective:    Patient ID: [name], female    DOB: 1970-10-19, 42 y.o.   MRN: [id number]  HPI Pt was dx'ed with primary hypothyroidism in 2013.  She took synthroid for only a few weeks, due to rash.  She has slight numbness of the hands and feet, and assoc tingling.  She says she is at risk for pregnancy.  She has bilat itching of eac's and decreased hearing.  Past Medical History  Diagnosis Date  . Esophageal stricture   . Complication of anesthesia     pt reports waking up during surgery  . Normal pregnancy 04/19/2011  . Postpartum care following cesarean delivery 04/19/2011    Past Surgical History  Procedure Laterality Date  . Esophageal dilation    . Cesarean section  04/19/2011    Procedure: CESAREAN SECTION;  Surgeon: Roselle Locus II;  Location: [facility];  Service: Gynecology;  Laterality: N/A;  Primary Ceserean section delivery of baby  boy apgar 7/8  cord ph  7.06 - Baby,mom,dad banded by Nevada Crane RN & Colin Mulders RN     History   Social History  . Marital Status: Married    Spouse Name: N/A    Number of Children: N/A  . Years of Education: N/A   Occupational History  . Not on file.   Social History Main Topics  . Smoking status: Never Smoker   . Smokeless tobacco: Never Used  . Alcohol Use: No  . Drug Use:   . Sexual Activity: Yes   Other Topics Concern  . Not on file   Social History Narrative  . No narrative on file   Current Outpatient Prescriptions on File Prior to Visit  Medication Sig Dispense Refill  . ibuprofen (ADVIL,MOTRIN) 100 MG/5ML suspension Take 30 mLs (600 mg total) by mouth every 6 (six) hours as needed for pain.  473 mL  1  . levothyroxine (SYNTHROID, LEVOTHROID) 50 MCG tablet Take 1 tablet (50 mcg total) by mouth daily.  90 tablet  3  . prenatal vitamin w/FE, FA (PRENATAL 1 + 1) 27-1 MG TABS Take 1 tablet by mouth daily.         No current facility-administered medications on file prior to visit.   Allergies  Allergen  Reactions  . Eggs Or Egg-Derived Products Swelling  . Latex Other (See Comments)    REACTION UNKNOWN   History reviewed. No pertinent family history.  BP 112/78  Pulse 80  Temp(Src) 98.5 F (36.9 C) (Oral)  Resp 10  Wt 157 lb 14.4 oz (71.623 kg)  BMI 27.98 kg/m2  SpO2 99%  Review of Systems She has chronic headache and fatigue.  She has difficulty with concentration.      Objective:   Physical Exam VITAL SIGNS:  See vs page. GENERAL: no distress. NECK: There is no palpable thyroid enlargement.  No thyroid nodule is palpable.  No palpable lymphadenopathy at the anterior neck. Both eac's and tm's are normal     Assessment & Plan:  Hypothyroidism: therapy limited by noncompliance.  This is risky, especially in view of her risk for pregnancy.  i'll do the best i can.   Ear sxs, probably due to eczema Acral numbness, new. uncertain etiology

## 2013-08-02 ENCOUNTER — Other Ambulatory Visit: Payer: Self-pay

## 2014-04-30 ENCOUNTER — Other Ambulatory Visit: Payer: Self-pay | Admitting: Obstetrics and Gynecology

## 2014-05-01 LAB — CYTOLOGY - PAP

## 2014-07-29 ENCOUNTER — Encounter: Payer: Self-pay | Admitting: Endocrinology

## 2014-10-04 ENCOUNTER — Encounter: Payer: Self-pay | Admitting: Gastroenterology

## 2015-02-12 ENCOUNTER — Other Ambulatory Visit: Payer: Self-pay | Admitting: Obstetrics and Gynecology

## 2015-02-13 LAB — CYTOLOGY - PAP

## 2015-06-26 ENCOUNTER — Inpatient Hospital Stay (HOSPITAL_COMMUNITY)
Admission: AD | Admit: 2015-06-26 | Discharge: 2015-06-26 | Disposition: A | Discharge status: Home / Self Care | Payer: BLUE CROSS/BLUE SHIELD | Source: Ambulatory Visit | Attending: Obstetrics and Gynecology | Admitting: Obstetrics and Gynecology

## 2015-06-26 ENCOUNTER — Encounter (HOSPITAL_COMMUNITY): Payer: Self-pay | Admitting: *Deleted

## 2015-06-26 ENCOUNTER — Inpatient Hospital Stay (HOSPITAL_COMMUNITY): Payer: BLUE CROSS/BLUE SHIELD

## 2015-06-26 DIAGNOSIS — Z3A31 31 weeks gestation of pregnancy: Secondary | ICD-10-CM | POA: Diagnosis not present

## 2015-06-26 DIAGNOSIS — O368190 Decreased fetal movements, unspecified trimester, not applicable or unspecified: Secondary | ICD-10-CM

## 2015-06-26 DIAGNOSIS — O4703 False labor before 37 completed weeks of gestation, third trimester: Secondary | ICD-10-CM | POA: Diagnosis not present

## 2015-06-26 DIAGNOSIS — O09523 Supervision of elderly multigravida, third trimester: Secondary | ICD-10-CM | POA: Insufficient documentation

## 2015-06-26 DIAGNOSIS — O368130 Decreased fetal movements, third trimester, not applicable or unspecified: Secondary | ICD-10-CM | POA: Insufficient documentation

## 2015-06-26 DIAGNOSIS — O479 False labor, unspecified: Secondary | ICD-10-CM

## 2015-06-26 LAB — URINALYSIS, ROUTINE W REFLEX MICROSCOPIC
Bilirubin Urine: NEGATIVE
GLUCOSE, UA: NEGATIVE mg/dL
HGB URINE DIPSTICK: NEGATIVE
Ketones, ur: NEGATIVE mg/dL
Nitrite: NEGATIVE
PH: 6 (ref 5.0–8.0)
Protein, ur: NEGATIVE mg/dL
Specific Gravity, Urine: 1.025 (ref 1.005–1.030)
Urobilinogen, UA: 0.2 mg/dL (ref 0.0–1.0)

## 2015-06-26 LAB — FETAL FIBRONECTIN: Fetal Fibronectin: NEGATIVE

## 2015-06-26 LAB — URINE MICROSCOPIC-ADD ON

## 2015-06-26 MED ORDER — NIFEDIPINE 10 MG PO CAPS
10.0000 mg | ORAL_CAPSULE | Freq: Once | ORAL | Status: AC
  Administered 2015-06-26: 10 mg via ORAL
  Filled 2015-06-26: qty 1

## 2015-06-26 NOTE — Discharge Instructions (Signed)
Braxton Hicks Contractions °Contractions of the uterus can occur throughout pregnancy. Contractions are not always a sign that you are in labor.  °WHAT ARE BRAXTON HICKS CONTRACTIONS?  °Contractions that occur before labor are called Braxton Hicks contractions, or false labor. Toward the end of pregnancy (32-34 weeks), these contractions can develop more often and may become more forceful. This is not true labor because these contractions do not result in opening (dilatation) and thinning of the cervix. They are sometimes difficult to tell apart from true labor because these contractions can be forceful and people have different pain tolerances. You should not feel embarrassed if you go to the hospital with false labor. Sometimes, the only way to tell if you are in true labor is for your health care provider to look for changes in the cervix. °If there are no prenatal problems or other health problems associated with the pregnancy, it is completely safe to be sent home with false labor and await the onset of true labor. °HOW CAN YOU TELL THE DIFFERENCE BETWEEN TRUE AND FALSE LABOR? °False Labor °· The contractions of false labor are usually shorter and not as hard as those of true labor.   °· The contractions are usually irregular.   °· The contractions are often felt in the front of the lower abdomen and in the groin.   °· The contractions may go away when you walk around or change positions while lying down.   °· The contractions get weaker and are shorter lasting as time goes on.   °· The contractions do not usually become progressively stronger, regular, and closer together as with true labor.   °True Labor °· Contractions in true labor last 30-70 seconds, become very regular, usually become more intense, and increase in frequency.   °· The contractions do not go away with walking.   °· The discomfort is usually felt in the top of the uterus and spreads to the lower abdomen and low back.   °· True labor can be  determined by your health care provider with an exam. This will show that the cervix is dilating and getting thinner.   °WHAT TO REMEMBER °· Keep up with your usual exercises and follow other instructions given by your health care provider.   °· Take medicines as directed by your health care provider.   °· Keep your regular prenatal appointments.   °· Eat and drink lightly if you think you are going into labor.   °· If Braxton Hicks contractions are making you uncomfortable:   °¨ Change your position from lying down or resting to walking, or from walking to resting.   °¨ Sit and rest in a tub of warm water.   °¨ Drink 2-3 glasses of water. Dehydration may cause these contractions.   °¨ Do slow and deep breathing several times an hour.   °WHEN SHOULD I SEEK IMMEDIATE MEDICAL CARE? °Seek immediate medical care if: °· Your contractions become stronger, more regular, and closer together.   °· You have fluid leaking or gushing from your vagina.   °· You have a fever.   °· You pass blood-tinged mucus.   °· You have vaginal bleeding.   °· You have continuous abdominal pain.   °· You have low back pain that you never had before.   °· You feel your baby's head pushing down and causing pelvic pressure.   °· Your baby is not moving as much as it used to.   °Document Released: 09/13/2005 Document Revised: 09/18/2013 Document Reviewed: 06/25/2013 °[employer]® Patient Information ©2015 [employer]. This information is not intended to replace advice given to you by your health care   provider. Make sure you discuss any questions you have with your health care provider.  Fetal Fibronectin This is a test done to help evaluate a pregnant woman's risk of pre-term delivery. It is generally done when you are 26 to [redacted] weeks pregnant and are having symptoms of premature labor. A Dacron swab is used to take a sample of cervical or vaginal fluid from the back portion of the vagina or from the area just outside the opening of the  cervix. Fetal fibronectin (fFN) is a glycoprotein that can be used to help predict the short term risk of premature delivery. fFN is produced at the boundary between the amniotic sac and the lining of the mother's uterus. This is called the unteroplacental junction. Fetal fibronectin is largely confined to this junction and thought to help maintain the integrity of the boundary. fFN is normally detectable in cervicovaginal fluid during the first 20 to 24 weeks of pregnancy, and then is detectable again after about 36 weeks.  Finding fFN in cervicovaginal fluids after 36 weeks is not unusual as it is often released by the body as it gets ready for childbirth. The elevated fFN found in vaginal fluids early in pregnancy may simply reflect the normal growth and establishment of tissues at the unteroplacental junction with levels falling when this phase is complete. What is known is that fFN that is detected between 24 and 36 weeks of pregnancy is not normal. Elevated levels reflect a disturbance at the uteroplacental junction and have been associated with an increased risk of pre-term labor and delivery. Knowing whether or not a woman is likely to deliver prematurely helps your caregiver plan a course of action. The fFN test is a relatively non-invasive tool to help the caregiver to distinguish between those who are likely to deliver shortly and those who are not.  PREPARATION FOR TEST   Inform the person conducting the test if you have a medical condition or are using any medications that cause excessive bleeding.  Do not have sexual intercourse for 24 hours before the procedure. NORMAL FINDINGS  Pregnancy = 50 nanograms/ml Ranges for normal findings may vary among different laboratories and hospitals. You should always check with your doctor after having lab work or other tests done to discuss the meaning of your test results and whether your values are considered within normal limits. MEANING OF TEST   Your caregiver will go over the test results with you and discuss the importance and meaning of your results, as well as treatment options and the need for additional tests if necessary. OBTAINING THE TEST RESULTS  It is your responsibility to obtain your test results. Ask the lab or department performing the test when and how you will get your results. Document Released: 07/15/2004 Document Revised: 12/06/2011 Document Reviewed: 12/10/2013 [facility] Patient Information 2015 [place], [place]. This information is not intended to replace advice given to you by your health care provider. Make sure you discuss any questions you have with your health care provider.  Fetal Biophysical Profile This is a test that measures five different variables of the fetus: Heart rate, breathing movement, total movement of the baby, fetal muscle tone, the amount of amniotic fluid, and the heart rate activity of the fetus. The five variables are measured individually and contribute either a 2 or a 0 to the overall scoring of the test. The measurements are as follows:  Fetal heart rate activity. This is measured and scored in the same way as a non-stress test.  The fetal heart rate is considered reactive when there are movement-associated fetal heart rate increases of at least 15 beats per minute above baseline, and 15 seconds in duration over a 20-minute period. A score of 2 is given for reactivity, and a score of 0 indicates that the fetal heart rate is non-reactive.  Fetal breathing movements. This is scored based on fetal breathing movements and indicate fetal well-being. Their absence may indicate a low oxygen level for the fetus. Fetal breathing increases in frequency and uniformity after the 36th week of pregnancy. To earn a score of 2, the fetus must have at least one episode of fetal breathing lasting at least 60 seconds within a 30-minute observation. Absence of this breathing is scored a 0 on the BPP.  Fetal body  movements. Fetal activity is a reflection of brain integrity and function. The presence of at least three episodes of fetal movements within a 30-minute period is given a score of 2. A score of 0 is given with two or less movements in this time period. Fetal activity is highest 1 to 3 hours after the mother has eaten a meal.  Fetal tone. In the uterus, the fetus is normally in a position of flexion. This means the head is bent down towards the knees. The fetus also stretches, rolls, and moves in the uterus. The arms, legs, trunk, and head may be flexed and extended. A score of 2 is earned when there is at least one episode of active extension with return flexion. A score of 0 is given for slow extension with a return to only partial flexion. Fetal movement not followed by return to flexion, limbs or spine in extension, and an open fetal hand score 0.  Amniotic fluid volume. Amniotic fluid volume has been demonstrated to be a good method of predicting fetal distress. Too little amniotic fluid has been associated with fetal abnormalities, slow uterine growth, and over due pregnancy. A score of 2 is given for this when there is at least one pocket of amniotic fluid that measures 1 cm in a specific area. A score of 0 indicates either that fluid is absent in most areas of the uterine cavity or that the largest pocket of fluid measures less than 1 cm. PREPARATION FOR TEST No preparation or fasting is necessary. NORMAL FINDINGS  A score of 8-10 points (if amniotic fluid volume is adequate).  Possible critical values: Less than 4 may necessitate immediate delivery of fetus. Ranges for normal findings may vary among different laboratories and hospitals. You should always check with your doctor after having lab work or other tests done to discuss the meaning of your test results and whether your values are considered within normal limits. MEANING OF TEST  Your caregiver will go over the test results with you and  discuss the importance and meaning of your results, as well as treatment options and the need for additional tests if necessary. OBTAINING THE TEST RESULTS  It is your responsibility to obtain your test results. Ask the lab or department performing the test when and how you will get your results. Document Released: 01/14/2005 Document Revised: 12/06/2011 Document Reviewed: 08/23/2008 [facility] Patient Information 2015 [place], [place]. This information is not intended to replace advice given to you by your health care provider. Make sure you discuss any questions you have with your health care provider.

## 2015-06-26 NOTE — MAU Provider Note (Signed)
History     CSN: [id number]  Arrival date and time: 06/26/15 6213   First Provider Initiated Contact with Patient 06/26/15 [phone]      Chief Complaint  Patient presents with  . Decreased Fetal Movement   HPI   Ms.[name] is a  44 y.o. female [id number] presenting with decreased fetal movement. She had a cramp like pain in her lower abdomen that started last night around 5:00; the pain was located in her lower stomach and wrapped around to her back. She says her pain last night was about a 5/10, the pain lasted until 9 pm and then stopped. When she woke up this morning she felt that her pain was gone, however the baby was not moving as much. She says her pain now is 0/10.  No intercourse in the last 24 hours.   OB History    Gravida Para Term Preterm AB TAB SAB Ectopic Multiple Living   0 3 0 3 0 0 2      Past Medical History  Diagnosis Date  . Esophageal stricture   . Complication of anesthesia     pt reports waking up during surgery  . Normal pregnancy 04/19/2011  . Postpartum care following cesarean delivery 04/19/2011    Past Surgical History  Procedure Laterality Date  . Esophageal dilation    . Cesarean section  04/19/2011    Procedure: CESAREAN SECTION;  Surgeon: Roselle Locus II;  Location: [facility];  Service: Gynecology;  Laterality: N/A;  Primary Ceserean section delivery of baby  boy apgar 7/8  cord ph  7.06 - Baby,mom,dad banded by Nevada Crane RN & Colin Mulders RN     History reviewed. No pertinent family history.  Social History  Substance Use Topics  . Smoking status: Never Smoker   . Smokeless tobacco: Never Used  . Alcohol Use: No    Allergies:  Allergies  Allergen Reactions  . Eggs Or Egg-Derived Products Swelling  . Latex Other (See Comments)    REACTION UNKNOWN    Prescriptions prior to admission  Medication Sig Dispense Refill Last Dose  . flintstones complete (FLINTSTONES) 60 MG chewable tablet Chew 1 tablet by mouth daily.    06/25/2015 at Unknown time  . levothyroxine (SYNTHROID, LEVOTHROID) 50 MCG tablet Take 1 tablet (50 mcg total) by mouth daily. (Patient not taking: Reported on 06/26/2015) 90 tablet 3   . NEOMYCIN-POLYMYXIN-HYDROCORTISONE (CORTISPORIN) 1 % SOLN otic solution Place 3 drops into both ears every 6 (six) hours. (Patient not taking: Reported on 06/26/2015) 20 mL 0    Results for orders placed or performed during the hospital encounter of 06/26/15 (from the past 48 hour(s))  Fetal fibronectin     Status: None   Collection Time: 06/26/15 10:00 AM  Result Value Ref Range   Fetal Fibronectin NEGATIVE NEGATIVE  Urinalysis, Routine w reflex microscopic (not at [facility])     Status: Abnormal   Collection Time: 06/26/15 10:02 AM  Result Value Ref Range   Color, Urine YELLOW YELLOW   APPearance CLEAR CLEAR   Specific Gravity, Urine 1.025 1.005 - 1.030   pH 6.0 5.0 - 8.0   Glucose, UA NEGATIVE NEGATIVE mg/dL   Hgb urine dipstick NEGATIVE NEGATIVE   Bilirubin Urine NEGATIVE NEGATIVE   Ketones, ur NEGATIVE NEGATIVE mg/dL   Protein, ur NEGATIVE NEGATIVE mg/dL   Urobilinogen, UA 0.2 0.0 - 1.0 mg/dL   Nitrite NEGATIVE NEGATIVE   Leukocytes, UA TRACE (A) NEGATIVE  Urine  microscopic-add on     Status: Abnormal   Collection Time: 06/26/15 10:02 AM  Result Value Ref Range   Squamous Epithelial / LPF FEW (A) RARE   WBC, UA 0-2 <3 WBC/hpf   Bacteria, UA RARE RARE       Review of Systems  Constitutional: Negative for fever and chills.  Gastrointestinal: Negative for nausea, vomiting and abdominal pain.  Genitourinary: Negative for dysuria and urgency.   Physical Exam   Blood pressure 123/70, pulse 86, temperature 98.4 F (36.9 C), temperature source Oral, resp. rate 16, height 5' 3.5" (1.613 m), weight 78.291 kg (172 lb 9.6 oz).  Physical Exam  Constitutional: She is oriented to person, place, and time. She appears well-developed and well-nourished. No distress.  HENT:  Head: Normocephalic.  Eyes:  Pupils are equal, round, and reactive to light.  Neck: Neck supple.  Respiratory: Effort normal.  GI: Soft. She exhibits no distension. There is no tenderness.  Genitourinary:  Cervix  FT, 40%, fetal part ballotable  Chaperone present for exam.  Musculoskeletal: Normal range of motion.  Neurological: She is alert and oriented to person, place, and time.  Skin: Skin is warm. She is not diaphoretic.  Psychiatric: Her behavior is normal.   Fetal Tracing: Baseline: 135 bpm  Variability: Moderate  Accelerations: 15x15 Decelerations: Quick variables  Toco: Mild; 2-3 mins apart. None following procardia    MAU Course  Procedures  None  MDM  Discussed HPI> fetal tracing at 1115 with Dr. Henderson Cloud. BPP and OB limited ordered Fetal fibronectin collected Procardia 10 mg given PO; contractions subsided, patient continues to rate her pain 0/10 Dr. Henderson Cloud notified of results.    BPP 8/8 Cervical length 4.2 cm   Assessment and Plan   A:  1. Braxton Hicks contractions   2. Decreased fetal movement    P:  Discharge home in stable condition Fetal kick counts reviewed Follow up with Dr. Henderson Cloud on Monday as scheduled  Return to MAU as needed if symptoms worsen  Preterm labor precautions.     Duane Lope, NP 06/26/2015 3:14 PM

## 2015-06-26 NOTE — MAU Note (Signed)
Pt stated had not felt baby move much since last night . Usually very active. Had a stomch cramp last night that lasted for several hours and then went away. No pain now.

## 2015-08-11 NOTE — H&P (Signed)
[facility]  DICTATION # [id number] CSN# [id number]   Meriel PicaHOLLAND,Ramanda Paules M, MD 08/11/2015 2:30 PM

## 2015-08-12 NOTE — H&P (Signed)
NAMDemetria Pore:  [name], [name]               ACCOUNT NO.:  [id number]  MEDICAL RECORD NO.:  [id number]  LOCATION:                                FACILITY:  [facility]  PHYSICIAN:  Duke Salviaichard M. [name], M.D.DATE OF BIRTH:  [birth year]  DATE OF ADMISSION:  08/28/2015 DATE OF DISCHARGE:                             HISTORY & PHYSICAL   CHIEF COMPLAINT:  Repeat cesarean section at term.  HISTORY OF PRESENT ILLNESS:  A 44 year old 644, P2-0-1-2 has had one prior vaginal delivery in 2009, one cesarean section in 2012 for compound presentation.  She presents now for RCS at term.  The procedure including specific risks related to bleeding, infection, transfusion, wound infection, phlebitis all discussed with her which she understands and accepts.  Prior CF screen negative.  Her blood type is A-.  She had a panoramic drawn which was normal.  Her one-hour GTT was slightly elevated, three-hour GTT returned normal.  She has a history of HSV, has been on Valtrex once daily since 36 weeks.  ALLERGIES:  Latex.  No other medication allergies.  For the remainder of her past medical history, please see the [facility] form for details.  PHYSICAL EXAMINATION:  VITAL SIGNS:  Temp 98.2, blood pressure 120/84. HEENT:  Unremarkable. NECK:  Supple without masses. LUNGS:  Clear. CARDIOVASCULAR:  Regular rate and rhythm without murmurs, rubs, or gallops noted. BREASTS:  Not examined. PELVIC:  Term fundal height.  Fetal heart rate was 140.  Cervix was closed. EXTREMITIES:  Unremarkable. NEUROLOGIC:  Unremarkable.  IMPRESSION AND PLAN:  Term pregnancy, normal panoramic study, previous cesarean section for repeat cesarean section.  Procedure and risks were discussed as above.     Quintus Premo M. [name], M.D.     RMH/MEDQ  D:  08/11/2015  T:  08/11/2015  Job:  [id number]

## 2015-08-18 ENCOUNTER — Encounter (HOSPITAL_COMMUNITY)
Admission: AD | Disposition: A | Discharge status: Home / Self Care | Payer: Self-pay | Source: Ambulatory Visit | Attending: Obstetrics & Gynecology

## 2015-08-18 ENCOUNTER — Inpatient Hospital Stay (HOSPITAL_COMMUNITY): Payer: BLUE CROSS/BLUE SHIELD | Admitting: Anesthesiology

## 2015-08-18 ENCOUNTER — Inpatient Hospital Stay (HOSPITAL_COMMUNITY)
Admission: AD | Admit: 2015-08-18 | Discharge: 2015-08-20 | DRG: 765 | Disposition: A | Discharge status: Home / Self Care | Payer: BLUE CROSS/BLUE SHIELD | Source: Ambulatory Visit | Attending: Obstetrics & Gynecology | Admitting: Obstetrics & Gynecology

## 2015-08-18 ENCOUNTER — Encounter (HOSPITAL_COMMUNITY): Payer: Self-pay

## 2015-08-18 DIAGNOSIS — O9832 Other infections with a predominantly sexual mode of transmission complicating childbirth: Secondary | ICD-10-CM | POA: Diagnosis present

## 2015-08-18 DIAGNOSIS — Z3A37 37 weeks gestation of pregnancy: Secondary | ICD-10-CM

## 2015-08-18 DIAGNOSIS — O34211 Maternal care for low transverse scar from previous cesarean delivery: Secondary | ICD-10-CM | POA: Diagnosis present

## 2015-08-18 DIAGNOSIS — O09523 Supervision of elderly multigravida, third trimester: Secondary | ICD-10-CM

## 2015-08-18 DIAGNOSIS — Z6791 Unspecified blood type, Rh negative: Secondary | ICD-10-CM | POA: Diagnosis not present

## 2015-08-18 DIAGNOSIS — O4292 Full-term premature rupture of membranes, unspecified as to length of time between rupture and onset of labor: Secondary | ICD-10-CM | POA: Diagnosis present

## 2015-08-18 DIAGNOSIS — A6000 Herpesviral infection of urogenital system, unspecified: Secondary | ICD-10-CM | POA: Diagnosis present

## 2015-08-18 DIAGNOSIS — Z98891 History of uterine scar from previous surgery: Secondary | ICD-10-CM

## 2015-08-18 DIAGNOSIS — O26893 Other specified pregnancy related conditions, third trimester: Secondary | ICD-10-CM | POA: Diagnosis present

## 2015-08-18 LAB — CBC
HEMATOCRIT: 32.9 % — AB (ref 36.0–46.0)
HEMOGLOBIN: 11 g/dL — AB (ref 12.0–15.0)
MCH: 29.3 pg (ref 26.0–34.0)
MCHC: 33.4 g/dL (ref 30.0–36.0)
MCV: 87.5 fL (ref 78.0–100.0)
Platelets: 159 10*3/uL (ref 150–400)
RBC: 3.76 MIL/uL — AB (ref 3.87–5.11)
RDW: 16.5 % — ABNORMAL HIGH (ref 11.5–15.5)
WBC: 9.3 10*3/uL (ref 4.0–10.5)

## 2015-08-18 LAB — RPR: RPR: NONREACTIVE

## 2015-08-18 SURGERY — Surgical Case
Anesthesia: Spinal | Site: Abdomen

## 2015-08-18 MED ORDER — SCOPOLAMINE 1 MG/3DAYS TD PT72: 1.0000 | MEDICATED_PATCH | Freq: Once | TRANSDERMAL | Status: DC

## 2015-08-18 MED ORDER — CEFAZOLIN SODIUM-DEXTROSE 2-3 GM-% IV SOLR
2.0000 g | INTRAVENOUS | Status: AC
  Administered 2015-08-18: 2 g via INTRAVENOUS
  Filled 2015-08-18: qty 50

## 2015-08-18 MED ORDER — WITCH HAZEL-GLYCERIN EX PADS: 1.0000 "application " | MEDICATED_PAD | CUTANEOUS | Status: DC | PRN

## 2015-08-18 MED ORDER — MORPHINE SULFATE (PF) 0.5 MG/ML IJ SOLN
INTRAMUSCULAR | Status: DC | PRN
  Administered 2015-08-18: .2 mg via INTRATHECAL

## 2015-08-18 MED ORDER — OXYTOCIN 10 UNIT/ML IJ SOLN
INTRAMUSCULAR | Status: AC
  Filled 2015-08-18: qty 4

## 2015-08-18 MED ORDER — MORPHINE SULFATE (PF) 0.5 MG/ML IJ SOLN
INTRAMUSCULAR | Status: AC
  Filled 2015-08-18: qty 10

## 2015-08-18 MED ORDER — KETOROLAC TROMETHAMINE 30 MG/ML IJ SOLN: 30.0000 mg | Freq: Four times a day (QID) | INTRAMUSCULAR | Status: DC | PRN

## 2015-08-18 MED ORDER — ACETAMINOPHEN 160 MG/5ML PO SOLN
650.0000 mg | ORAL | Status: DC | PRN
  Administered 2015-08-19 – 2015-08-20 (×3): 650 mg via ORAL
  Filled 2015-08-18 (×3): qty 20.3

## 2015-08-18 MED ORDER — ONDANSETRON HCL 4 MG/2ML IJ SOLN
INTRAMUSCULAR | Status: AC
  Filled 2015-08-18: qty 2

## 2015-08-18 MED ORDER — SENNOSIDES-DOCUSATE SODIUM 8.6-50 MG PO TABS
2.0000 | ORAL_TABLET | ORAL | Status: DC
  Filled 2015-08-18: qty 2

## 2015-08-18 MED ORDER — OXYTOCIN 40 UNITS IN LACTATED RINGERS INFUSION - SIMPLE MED: 62.5000 mL/h | INTRAVENOUS | Status: AC

## 2015-08-18 MED ORDER — KETOROLAC TROMETHAMINE 30 MG/ML IJ SOLN
INTRAMUSCULAR | Status: AC
  Administered 2015-08-18: 30 mg
  Filled 2015-08-18: qty 1

## 2015-08-18 MED ORDER — ONDANSETRON HCL 4 MG/2ML IJ SOLN
INTRAMUSCULAR | Status: DC | PRN
  Administered 2015-08-18: 4 mg via INTRAVENOUS

## 2015-08-18 MED ORDER — PHENYLEPHRINE HCL 10 MG/ML IJ SOLN
INTRAMUSCULAR | Status: DC | PRN
  Administered 2015-08-18: 80 ug via INTRAVENOUS

## 2015-08-18 MED ORDER — OXYCODONE-ACETAMINOPHEN 5-325 MG PO TABS: 1.0000 | ORAL_TABLET | ORAL | Status: DC | PRN

## 2015-08-18 MED ORDER — BUPIVACAINE IN DEXTROSE 0.75-8.25 % IT SOLN
INTRATHECAL | Status: DC | PRN
  Administered 2015-08-18: 10.5 mg via INTRATHECAL

## 2015-08-18 MED ORDER — LANOLIN HYDROUS EX OINT: 1.0000 "application " | TOPICAL_OINTMENT | CUTANEOUS | Status: DC | PRN

## 2015-08-18 MED ORDER — MEPERIDINE HCL 25 MG/ML IJ SOLN: 6.2500 mg | INTRAMUSCULAR | Status: DC | PRN

## 2015-08-18 MED ORDER — SIMETHICONE 80 MG PO CHEW: 80.0000 mg | CHEWABLE_TABLET | ORAL | Status: DC | PRN

## 2015-08-18 MED ORDER — PHENYLEPHRINE 40 MCG/ML (10ML) SYRINGE FOR IV PUSH (FOR BLOOD PRESSURE SUPPORT)
PREFILLED_SYRINGE | INTRAVENOUS | Status: AC
  Filled 2015-08-18: qty 10

## 2015-08-18 MED ORDER — FENTANYL CITRATE (PF) 100 MCG/2ML IJ SOLN
INTRAMUSCULAR | Status: DC | PRN
  Administered 2015-08-18: 20 ug via INTRATHECAL

## 2015-08-18 MED ORDER — DIPHENHYDRAMINE HCL 25 MG PO CAPS: 25.0000 mg | ORAL_CAPSULE | Freq: Four times a day (QID) | ORAL | Status: DC | PRN

## 2015-08-18 MED ORDER — ACETAMINOPHEN 160 MG/5ML PO SOLN
325.0000 mg | ORAL | Status: DC | PRN
  Administered 2015-08-18: 325 mg via ORAL
  Filled 2015-08-18: qty 20.3

## 2015-08-18 MED ORDER — OXYCODONE HCL 5 MG/5ML PO SOLN
5.0000 mg | ORAL | Status: DC | PRN
  Administered 2015-08-18 – 2015-08-19 (×2): 5 mg via ORAL
  Filled 2015-08-18 (×2): qty 5

## 2015-08-18 MED ORDER — NALOXONE HCL 2 MG/2ML IJ SOSY
1.0000 ug/kg/h | PREFILLED_SYRINGE | INTRAVENOUS | Status: DC | PRN
  Filled 2015-08-18: qty 2

## 2015-08-18 MED ORDER — NALOXONE HCL 0.4 MG/ML IJ SOLN: 0.4000 mg | INTRAMUSCULAR | Status: DC | PRN

## 2015-08-18 MED ORDER — CITRIC ACID-SODIUM CITRATE 334-500 MG/5ML PO SOLN
30.0000 mL | Freq: Once | ORAL | Status: AC
  Administered 2015-08-18: 30 mL via ORAL
  Filled 2015-08-18: qty 15

## 2015-08-18 MED ORDER — LACTATED RINGERS IV SOLN
INTRAVENOUS | Status: DC
Start: 2015-08-18 — End: 2015-08-20
  Administered 2015-08-18 (×2): via INTRAVENOUS

## 2015-08-18 MED ORDER — OXYCODONE HCL 5 MG/5ML PO SOLN
10.0000 mg | ORAL | Status: DC | PRN
  Administered 2015-08-19 – 2015-08-20 (×3): 10 mg via ORAL
  Filled 2015-08-18 (×3): qty 10

## 2015-08-18 MED ORDER — OXYCODONE-ACETAMINOPHEN 5-325 MG PO TABS: 2.0000 | ORAL_TABLET | ORAL | Status: DC | PRN

## 2015-08-18 MED ORDER — FENTANYL CITRATE (PF) 100 MCG/2ML IJ SOLN
INTRAMUSCULAR | Status: AC
  Filled 2015-08-18: qty 2

## 2015-08-18 MED ORDER — ZOLPIDEM TARTRATE 5 MG PO TABS: 5.0000 mg | ORAL_TABLET | Freq: Every evening | ORAL | Status: DC | PRN

## 2015-08-18 MED ORDER — PRENATAL MULTIVITAMIN CH
1.0000 | ORAL_TABLET | Freq: Every day | ORAL | Status: DC
  Filled 2015-08-18: qty 1

## 2015-08-18 MED ORDER — MENTHOL 3 MG MT LOZG: 1.0000 | LOZENGE | OROMUCOSAL | Status: DC | PRN

## 2015-08-18 MED ORDER — NALBUPHINE HCL 10 MG/ML IJ SOLN: 5.0000 mg | INTRAMUSCULAR | Status: DC | PRN

## 2015-08-18 MED ORDER — LACTATED RINGERS IV SOLN
INTRAVENOUS | Status: DC
  Administered 2015-08-18 (×4): via INTRAVENOUS

## 2015-08-18 MED ORDER — ONDANSETRON HCL 4 MG/2ML IJ SOLN: 4.0000 mg | Freq: Three times a day (TID) | INTRAMUSCULAR | Status: DC | PRN

## 2015-08-18 MED ORDER — PHENYLEPHRINE 8 MG IN D5W 100 ML (0.08MG/ML) PREMIX OPTIME
INJECTION | INTRAVENOUS | Status: AC
  Filled 2015-08-18: qty 100

## 2015-08-18 MED ORDER — SIMETHICONE 80 MG PO CHEW
80.0000 mg | CHEWABLE_TABLET | ORAL | Status: DC
  Administered 2015-08-19 (×2): 80 mg via ORAL
  Filled 2015-08-18 (×2): qty 1

## 2015-08-18 MED ORDER — IBUPROFEN 100 MG/5ML PO SUSP
600.0000 mg | Freq: Four times a day (QID) | ORAL | Status: DC
  Administered 2015-08-18 – 2015-08-20 (×7): 600 mg via ORAL
  Filled 2015-08-18 (×8): qty 30

## 2015-08-18 MED ORDER — TETANUS-DIPHTH-ACELL PERTUSSIS 5-2.5-18.5 LF-MCG/0.5 IM SUSP: 0.5000 mL | Freq: Once | INTRAMUSCULAR | Status: DC

## 2015-08-18 MED ORDER — DIBUCAINE 1 % RE OINT: 1.0000 "application " | TOPICAL_OINTMENT | RECTAL | Status: DC | PRN

## 2015-08-18 MED ORDER — PHENYLEPHRINE 8 MG IN D5W 100 ML (0.08MG/ML) PREMIX OPTIME
INJECTION | INTRAVENOUS | Status: DC | PRN
  Administered 2015-08-18: 60 ug/min via INTRAVENOUS

## 2015-08-18 MED ORDER — ONDANSETRON HCL 4 MG/2ML IJ SOLN
4.0000 mg | Freq: Once | INTRAMUSCULAR | Status: AC
  Administered 2015-08-18: 4 mg via INTRAVENOUS

## 2015-08-18 MED ORDER — SCOPOLAMINE 1 MG/3DAYS TD PT72
MEDICATED_PATCH | TRANSDERMAL | Status: AC
  Filled 2015-08-18: qty 1

## 2015-08-18 MED ORDER — FENTANYL CITRATE (PF) 100 MCG/2ML IJ SOLN: 25.0000 ug | INTRAMUSCULAR | Status: DC | PRN

## 2015-08-18 MED ORDER — DIPHENHYDRAMINE HCL 25 MG PO CAPS: 25.0000 mg | ORAL_CAPSULE | ORAL | Status: DC | PRN

## 2015-08-18 MED ORDER — OXYTOCIN 10 UNIT/ML IJ SOLN
40.0000 [IU] | INTRAVENOUS | Status: DC | PRN
  Administered 2015-08-18: 40 [IU] via INTRAVENOUS

## 2015-08-18 MED ORDER — DIPHENHYDRAMINE HCL 50 MG/ML IJ SOLN: 12.5000 mg | INTRAMUSCULAR | Status: DC | PRN

## 2015-08-18 MED ORDER — NALBUPHINE HCL 10 MG/ML IJ SOLN: 5.0000 mg | Freq: Once | INTRAMUSCULAR | Status: DC | PRN

## 2015-08-18 MED ORDER — IBUPROFEN 600 MG PO TABS
600.0000 mg | ORAL_TABLET | Freq: Four times a day (QID) | ORAL | Status: DC
  Filled 2015-08-18: qty 1

## 2015-08-18 MED ORDER — SCOPOLAMINE 1 MG/3DAYS TD PT72
MEDICATED_PATCH | TRANSDERMAL | Status: DC | PRN
  Administered 2015-08-18: 1 via TRANSDERMAL

## 2015-08-18 MED ORDER — SODIUM CHLORIDE 0.9 % IJ SOLN: 3.0000 mL | INTRAMUSCULAR | Status: DC | PRN

## 2015-08-18 MED ORDER — SIMETHICONE 80 MG PO CHEW
80.0000 mg | CHEWABLE_TABLET | Freq: Three times a day (TID) | ORAL | Status: DC
  Administered 2015-08-18 – 2015-08-20 (×5): 80 mg via ORAL
  Filled 2015-08-18 (×6): qty 1

## 2015-08-18 MED ORDER — FAMOTIDINE IN NACL 20-0.9 MG/50ML-% IV SOLN
20.0000 mg | Freq: Once | INTRAVENOUS | Status: AC
  Administered 2015-08-18: 20 mg via INTRAVENOUS
  Filled 2015-08-18: qty 50

## 2015-08-18 MED ORDER — SODIUM CHLORIDE 0.9 % IR SOLN
Status: DC | PRN
  Administered 2015-08-18: 1000 mL

## 2015-08-18 MED ORDER — COMPLETENATE 29-1 MG PO CHEW
1.0000 | CHEWABLE_TABLET | Freq: Every day | ORAL | Status: DC
  Administered 2015-08-18 – 2015-08-19 (×2): 1 via ORAL
  Filled 2015-08-18 (×3): qty 1

## 2015-08-18 MED ORDER — LACTATED RINGERS IV SOLN
INTRAVENOUS | Status: DC | PRN
  Administered 2015-08-18 (×2): via INTRAVENOUS

## 2015-08-18 MED ORDER — ACETAMINOPHEN 325 MG PO TABS: 650.0000 mg | ORAL_TABLET | ORAL | Status: DC | PRN

## 2015-08-18 SURGICAL SUPPLY — 42 items
APL SKNCLS STERI-STRIP NONHPOA (GAUZE/BANDAGES/DRESSINGS) ×1
BENZOIN TINCTURE PRP APPL 2/3 (GAUZE/BANDAGES/DRESSINGS) ×2 IMPLANT
CLAMP CORD UMBIL (MISCELLANEOUS) IMPLANT
CLOSURE WOUND 1/2 X4 (GAUZE/BANDAGES/DRESSINGS) ×1
CLOTH BEACON ORANGE TIMEOUT ST (SAFETY) ×3 IMPLANT
DRAPE SHEET LG 3/4 BI-LAMINATE (DRAPES) IMPLANT
DRSG OPSITE POSTOP 4X10 (GAUZE/BANDAGES/DRESSINGS) ×3 IMPLANT
DURAPREP 26ML APPLICATOR (WOUND CARE) ×3 IMPLANT
ELECT REM PT RETURN 9FT ADLT (ELECTROSURGICAL) ×3
ELECTRODE REM PT RTRN 9FT ADLT (ELECTROSURGICAL) ×1 IMPLANT
EXTRACTOR VACUUM KIWI (MISCELLANEOUS) IMPLANT
EXTRACTOR VACUUM M CUP 4 TUBE (SUCTIONS) IMPLANT
EXTRACTOR VACUUM M CUP 4' TUBE (SUCTIONS)
GLOVE BIO SURGEON STRL SZ 6 (GLOVE) ×1 IMPLANT
GLOVE BIOGEL PI IND STRL 6 (GLOVE) ×2 IMPLANT
GLOVE BIOGEL PI IND STRL 7.0 (GLOVE) ×1 IMPLANT
GLOVE BIOGEL PI INDICATOR 6 (GLOVE) ×4
GLOVE BIOGEL PI INDICATOR 7.0 (GLOVE) ×6
GLOVE SURG SS PI 6.0 STRL IVOR (GLOVE) ×2 IMPLANT
GLOVE SURG SS PI 7.0 STRL IVOR (GLOVE) ×2 IMPLANT
GOWN STRL REUS W/TWL LRG LVL3 (GOWN DISPOSABLE) ×6 IMPLANT
KIT ABG SYR 3ML LUER SLIP (SYRINGE) ×3 IMPLANT
LIQUID BAND (GAUZE/BANDAGES/DRESSINGS) IMPLANT
NDL HYPO 25X5/8 SAFETYGLIDE (NEEDLE) ×1 IMPLANT
NEEDLE HYPO 25X5/8 SAFETYGLIDE (NEEDLE) ×3 IMPLANT
NS IRRIG 1000ML POUR BTL (IV SOLUTION) ×3 IMPLANT
PACK C SECTION WH (CUSTOM PROCEDURE TRAY) ×3 IMPLANT
PAD OB MATERNITY 4.3X12.25 (PERSONAL CARE ITEMS) ×3 IMPLANT
PENCIL SMOKE EVAC W/HOLSTER (ELECTROSURGICAL) ×3 IMPLANT
STRIP CLOSURE SKIN 1/2X4 (GAUZE/BANDAGES/DRESSINGS) ×1 IMPLANT
SUT CHROMIC 0 CTX 36 (SUTURE) ×9 IMPLANT
SUT MON AB 2-0 CT1 27 (SUTURE) ×3 IMPLANT
SUT MON AB 3-0 SH 27 (SUTURE) ×3
SUT MON AB 3-0 SH27 (SUTURE) IMPLANT
SUT PDS AB 0 CT1 27 (SUTURE) IMPLANT
SUT PLAIN 0 NONE (SUTURE) IMPLANT
SUT VIC AB 0 CT1 36 (SUTURE) IMPLANT
SUT VIC AB 4-0 KS 27 (SUTURE) ×2 IMPLANT
SWABSTICK BENZOIN STERILE (MISCELLANEOUS) ×2 IMPLANT
TOWEL OR 17X24 6PK STRL BLUE (TOWEL DISPOSABLE) ×5 IMPLANT
TRAY FOLEY BAG SILVER LF 16FR (CATHETERS) ×2 IMPLANT
TRAY FOLEY CATH SILVER 14FR (SET/KITS/TRAYS/PACK) IMPLANT

## 2015-08-18 NOTE — Op Note (Signed)
Sandra SackJulie J [name] PROCEDURE DATE: 08/18/2015  PREOPERATIVE DIAGNOSIS: Intrauterine pregnancy at  5673w6d weeks gestation, previous C/S, PROM  POSTOPERATIVE DIAGNOSIS: The same  PROCEDURE: Primary Low Transverse Cesarean Section  SURGEON:  Dr. Mitchel HonourMegan Jamilette Suchocki  INDICATIONS: [name] is a 44 y.o. [id number] at 4473w6d scheduled for cesarean section secondary to PROM with previous C/S, desire for repeat.  The risks of cesarean section discussed with the patient included but were not limited to: bleeding which may require transfusion or reoperation; infection which may require antibiotics; injury to bowel, bladder, ureters or other surrounding organs; injury to the fetus; need for additional procedures including hysterectomy in the event of a life-threatening hemorrhage; placental abnormalities wth subsequent pregnancies, incisional problems, thromboembolic phenomenon and other postoperative/anesthesia complications. The patient concurred with the proposed plan, giving informed written consent for the procedure.    FINDINGS:  Viable female infant in cephalic presentation, APGARs:8,9  Weight pending Clear amniotic fluid.  Intact placenta, three vessel cord.  Grossly normal uterus, ovaries and fallopian tubes. .   ANESTHESIA: Spinal ESTIMATED BLOOD LOSS: 500 ml SPECIMENS: Placenta sent to L&D COMPLICATIONS: None immediate  PROCEDURE IN DETAIL:  The patient received intravenous antibiotics and had sequential compression devices applied to her lower extremities while in the preoperative area.  She was then taken to the operating room where spinal anesthesia was administered and was found to be adequate. She was then placed in a dorsal supine position with a leftward tilt, and prepped and draped in a sterile manner.  A foley catheter was placed into her bladder and attached to constant gravity.  After an adequate timeout was performed, a Pfannenstiel skin incision was made with scalpel and carried through to the  underlying layer of fascia. The fascia was incised in the midline and this incision was extended bilaterally using the Mayo scissors. Kocher clamps were applied to the superior aspect of the fascial incision and the underlying rectus muscles were dissected off bluntly. A similar process was carried out on the inferior aspect of the facial incision. The rectus muscles were separated in the midline bluntly and the peritoneum was entered bluntly.   Bladder flap was created sharply and developed bluntly.  Bladder blade was placed.  A transverse hysterotomy was made with a scalpel and extended bilaterally bluntly. The bladder blade was then removed. The infant was successfully delivered, and cord was clamped and cut and infant was handed over to awaiting neonatology team. Uterine massage was then administered and the placenta delivered intact with three-vessel cord. The uterus was cleared of clot and debris.  The hysterotomy was closed with 0 chromic.  A second imbricating suture of 0-chromic was used to reinforce the incision and aid in hemostasis.  The peritoneum and rectus muscles were noted to be hemostatic and were reapproximated using 3-0 monocryl.  The fascia was closed with 0-PDS in a running fashion with good restoration of anatomy.  The subcutaneus tissue was copiously irrigated.  The skin was closed with 4-0 Vicryl in a subcuticular fashion.  Pt tolerated the procedure will.  All counts were correct x2.  Pt went to the recovery room in stable condition.

## 2015-08-18 NOTE — H&P (Signed)
[name] is a 44 y.o. female presenting for PROM this AM.  She has h/o prev C/S and declines TOLAC.  AMA with neg NIPT.  Rh negative.  +HSV on suppression with no prodrome/outbreak.  Active FM.    Maternal Medical History:  Reason for admission: Rupture of membranes.   Contractions: Frequency: rare.   Perceived severity is mild.    Fetal activity: Perceived fetal activity is normal.   Last perceived fetal movement was within the past hour.    Prenatal complications: no prenatal complications Prenatal Complications - Diabetes: none.    OB History    Gravida Para Term Preterm AB TAB SAB Ectopic Multiple Living   6 2 2  0 3 0 3 0 0 2     Past Medical History  Diagnosis Date  . Esophageal stricture   . Complication of anesthesia     pt reports waking up during surgery  . Normal pregnancy 04/19/2011  . Postpartum care following cesarean delivery 04/19/2011   Past Surgical History  Procedure Laterality Date  . Esophageal dilation    . Cesarean section  04/19/2011    Procedure: CESAREAN SECTION;  Surgeon: Roselle LocusJames E Tomblin II;  Location: [facility];  Service: Gynecology;  Laterality: N/A;  Primary Ceserean section delivery of baby  boy apgar 7/8  cord ph  7.06 - Baby,mom,dad banded by Nevada Craneonna Herr RN & Colin MuldersBecky ZHang RN    Family History: family history is not on file. Social History:  reports that she has never smoked. She has never used smokeless tobacco. She reports that she does not drink alcohol or use illicit drugs.   Prenatal Transfer Tool  Maternal Diabetes: No Genetic Screening: Normal Maternal Ultrasounds/Referrals: Normal Fetal Ultrasounds or other Referrals:  None Maternal Substance Abuse:  No Significant Maternal Medications:  Meds include: Other: Valtrex Significant Maternal Lab Results:  Lab values include: Group B Strep negative Other Comments:  None  ROS  Dilation: 3 Effacement (%): 50 Station: -3 Exam by:: PharmacologistAmber Stovall RN Blood pressure 123/94, pulse 97,  temperature 98.2 F (36.8 C), temperature source Oral, resp. rate 20, height 5' 3.5" (1.613 m), weight 83.462 kg (184 lb), last menstrual period 11/26/2014, SpO2 99 %. Maternal Exam:  Uterine Assessment: Contraction strength is mild.  Contraction frequency is rare.   Abdomen: Patient reports no abdominal tenderness. Surgical scars: low transverse.   Fundal height is c/w dates.   Estimated fetal weight is 7#.       Physical Exam  Constitutional: She is oriented to person, place, and time. She appears well-developed and well-nourished.  GI: Soft. There is no rebound and no guarding.  Neurological: She is alert and oriented to person, place, and time.  Skin: Skin is warm and dry.  Psychiatric: She has a normal mood and affect. Her behavior is normal.    Prenatal labs: ABO, Rh:   Antibody:   Rubella:   RPR:    HBsAg:    HIV:    GBS:     Assessment/Plan: 21JY [id number] G6P2032 at 6963w6d with PROM, previous C/S -Patient is counseled re: TOLAC vs repeat C/S.  She elects to proceed with C/S.  She understands the risk of bleeding, infection, scarring and damage to surrounding structures.  All questions were answered and she wishes to porceed.    Remedy Corporan 08/18/2015, 5:31 AM

## 2015-08-18 NOTE — Anesthesia Postprocedure Evaluation (Signed)
Anesthesia Post Note  Patient: [name]  Procedure(s) Performed: Procedure(s) (LRB): CESAREAN SECTION (N/A)  Patient location during evaluation: PACU Anesthesia Type: Spinal Level of consciousness: oriented and awake and alert Pain management: pain level controlled Vital Signs Assessment: post-procedure vital signs reviewed and stable Respiratory status: spontaneous breathing, respiratory function stable and patient connected to nasal cannula oxygen Cardiovascular status: blood pressure returned to baseline and stable Postop Assessment: No backache and No signs of nausea or vomiting Anesthetic complications: no    Last Vitals:  Filed Vitals:   08/18/15 0445 08/18/15 0743  BP: 123/94 132/83  Pulse: 97 69  Temp:  36.3 C  Resp:  13    Last Pain:  Filed Vitals:   08/18/15 0748  PainSc: 0-No pain                 Roark Rufo

## 2015-08-18 NOTE — MAU Note (Signed)
Pt here with c/o rupture of membranes, clear fluid since 0215, denies any bleeding. Reports some cramping, positive fetal movement.

## 2015-08-18 NOTE — Transfer of Care (Signed)
Immediate Anesthesia Transfer of Care Note  Patient: [name]  Procedure(s) Performed: Procedure(s): CESAREAN SECTION (N/A)  Patient Location: PACU  Anesthesia Type:Spinal  Level of Consciousness: awake, alert , oriented and patient cooperative  Airway & Oxygen Therapy: Patient Spontanous Breathing  Post-op Assessment: Report given to RN and Post -op Vital signs reviewed and stable  Post vital signs: Reviewed and stable  Last Vitals:  TEMP 97.3 BP 132/82 RR 16 HR 66 POX 98 Complications: No apparent anesthesia complications

## 2015-08-18 NOTE — Anesthesia Preprocedure Evaluation (Signed)
Anesthesia Evaluation  Patient identified by MRN, date of birth, ID band Patient awake    Reviewed: Allergy & Precautions, NPO status , Patient's Chart, lab work & pertinent test results  History of Anesthesia Complications Negative for: history of anesthetic complications  Airway Mallampati: II  TM Distance: >3 FB Neck ROM: Full    Dental  (+) Teeth Intact   Pulmonary neg pulmonary ROS,    breath sounds clear to auscultation       Cardiovascular negative cardio ROS   Rhythm:Regular     Neuro/Psych negative neurological ROS  negative psych ROS   GI/Hepatic negative GI ROS, Neg liver ROS,   Endo/Other  negative endocrine ROS  Renal/GU negative Renal ROS     Musculoskeletal   Abdominal   Peds  Hematology negative hematology ROS (+)   Anesthesia Other Findings   Reproductive/Obstetrics (+) Pregnancy                             Anesthesia Physical Anesthesia Plan  ASA: II  Anesthesia Plan: Spinal   Post-op Pain Management:    Induction:   Airway Management Planned: Natural Airway, Nasal Cannula and Simple Face Mask  Additional Equipment:   Intra-op Plan:   Post-operative Plan:   Informed Consent: I have reviewed the patients History and Physical, chart, labs and discussed the procedure including the risks, benefits and alternatives for the proposed anesthesia with the patient or authorized representative who has indicated his/her understanding and acceptance.   Dental advisory given  Plan Discussed with: CRNA and Surgeon  Anesthesia Plan Comments:         Anesthesia Quick Evaluation

## 2015-08-18 NOTE — Anesthesia Procedure Notes (Signed)
Spinal Patient location during procedure: OR Staffing Anesthesiologist: Maguire Sime Preanesthetic Checklist Completed: patient identified, surgical consent, pre-op evaluation, timeout performed, IV checked, risks and benefits discussed and monitors and equipment checked Spinal Block Patient position: sitting Prep: site prepped and draped and DuraPrep Patient monitoring: heart rate, cardiac monitor, continuous pulse ox and blood pressure Approach: midline Location: L3-4 Injection technique: single-shot Needle Needle type: Pencan  Needle gauge: 24 G Needle length: 10 cm Assessment Sensory level: T6   

## 2015-08-19 ENCOUNTER — Encounter (HOSPITAL_COMMUNITY): Payer: Self-pay | Admitting: Obstetrics & Gynecology

## 2015-08-19 LAB — CBC
HEMATOCRIT: 28.8 % — AB (ref 36.0–46.0)
HEMOGLOBIN: 9.7 g/dL — AB (ref 12.0–15.0)
MCH: 29.8 pg (ref 26.0–34.0)
MCHC: 33.7 g/dL (ref 30.0–36.0)
MCV: 88.3 fL (ref 78.0–100.0)
Platelets: 137 10*3/uL — ABNORMAL LOW (ref 150–400)
RBC: 3.26 MIL/uL — ABNORMAL LOW (ref 3.87–5.11)
RDW: 16.8 % — AB (ref 11.5–15.5)
WBC: 10.1 10*3/uL (ref 4.0–10.5)

## 2015-08-19 MED ORDER — RHO D IMMUNE GLOBULIN 1500 UNIT/2ML IJ SOSY
300.0000 ug | PREFILLED_SYRINGE | Freq: Once | INTRAMUSCULAR | Status: AC
  Administered 2015-08-19: 300 ug via INTRAVENOUS
  Filled 2015-08-19: qty 2

## 2015-08-19 NOTE — Progress Notes (Signed)
Subjective: Postpartum Day one: Cesarean Delivery Patient reports tolerating PO and no problems voiding.    Objective: Vital signs in last 24 hours: Temp:  [97.6 F (36.4 C)-98.6 F (37 C)] 98.6 F (37 C) (11/22 0527) Pulse Rate:  [56-73] 68 (11/22 0725) Resp:  [14-23] 16 (11/22 0725) BP: (96-119)/(50-76) 99/60 mmHg (11/22 0725) SpO2:  [96 %-100 %] 96 % (11/22 0002)  Physical Exam:  General: alert Lochia: appropriate Uterine Fundus: firm Incision: healing well DVT Evaluation: No evidence of DVT seen on physical exam.   Recent Labs  08/18/15 0500 08/19/15 0614  HGB 11.0* 9.7*  HCT 32.9* 28.8*    Assessment/Plan: Status post Cesarean section. Doing well postoperatively.  Continue current care.  Cylis Ayars S 08/19/2015, 7:56 AM

## 2015-08-19 NOTE — Progress Notes (Signed)
Pt told RN that during her shower, at about 1645, she was washing her back and noticed numbness across her lower back.  Pt stated that she can feel the pressure of a touch but that sensation is dulled throughout the lower part of her back and top of her buttocks.  Pt reports normal sensation and movement in her lower extremities.  No visible abnormalities noted on exam.  [facility] on call notified.

## 2015-08-20 LAB — RH IG WORKUP (INCLUDES ABO/RH)
ABO/RH(D): A NEG
FETAL SCREEN: NEGATIVE
Gestational Age(Wks): 37
UNIT DIVISION: 0

## 2015-08-20 MED ORDER — IBUPROFEN 100 MG/5ML PO SUSP: 600.0000 mg | Freq: Four times a day (QID) | ORAL | Status: DC

## 2015-08-20 MED ORDER — OXYCODONE HCL 5 MG/5ML PO SOLN: 10.0000 mg | ORAL | Status: DC | PRN

## 2015-08-20 NOTE — Discharge Summary (Signed)
Obstetric Discharge Summary Reason for Admission: rupture of membranes Prenatal Procedures: ultrasound Intrapartum Procedures: cesarean: low cervical, transverse Postpartum Procedures: none Complications-Operative and Postpartum: none HEMOGLOBIN  Date Value Ref Range Status  08/19/2015 9.7* 12.0 - 15.0 g/dL Final   HCT  Date Value Ref Range Status  08/19/2015 28.8* 36.0 - 46.0 % Final    Physical Exam:  General: alert and cooperative Lochia: appropriate Uterine Fundus: firm Incision: healing well, honeycomb dressing removed and replaced DVT Evaluation: No evidence of DVT seen on physical exam. Negative Homan's sign. No cords or calf tenderness. No significant calf/ankle edema. Epidural site without ecchymosis, or erythema Discharge Diagnoses: Term Pregnancy-delivered  Discharge Information: Date: 08/20/2015 Activity: pelvic rest Diet: routine Medications: PNV, Ibuprofen and Percocet Condition: stable Instructions: refer to practice specific booklet Discharge to: home   Newborn Data: Live born female  Birth Weight: 7 lb 11.6 oz (3505 g) APGAR: 8, 9  Home with mother.  Catherina Pates G 08/20/2015, 8:44 AM

## 2015-08-22 LAB — TYPE AND SCREEN
ABO/RH(D): A NEG
Antibody Screen: POSITIVE
DAT, IgG: NEGATIVE
UNIT DIVISION: 0
Unit division: 0

## 2015-08-27 ENCOUNTER — Inpatient Hospital Stay (HOSPITAL_COMMUNITY)
Admission: RE | Admit: 2015-08-27 | Discharge: 2015-08-27 | Disposition: A | Discharge status: Home / Self Care | Payer: BLUE CROSS/BLUE SHIELD | Source: Ambulatory Visit

## 2015-08-28 ENCOUNTER — Inpatient Hospital Stay (HOSPITAL_COMMUNITY)
Admission: RE | Admit: 2015-08-28 | Payer: BLUE CROSS/BLUE SHIELD | Source: Ambulatory Visit | Admitting: Obstetrics and Gynecology

## 2015-08-28 ENCOUNTER — Encounter (HOSPITAL_COMMUNITY): Admission: RE | Payer: Self-pay | Source: Ambulatory Visit

## 2015-08-28 SURGERY — Surgical Case
Anesthesia: Regional

## 2016-08-28 DIAGNOSIS — L03019 Cellulitis of unspecified finger: Secondary | ICD-10-CM | POA: Diagnosis not present

## 2017-01-12 DIAGNOSIS — Z6826 Body mass index (BMI) 26.0-26.9, adult: Secondary | ICD-10-CM | POA: Diagnosis not present

## 2017-01-12 DIAGNOSIS — Z1231 Encounter for screening mammogram for malignant neoplasm of breast: Secondary | ICD-10-CM | POA: Diagnosis not present

## 2017-01-12 DIAGNOSIS — Z01419 Encounter for gynecological examination (general) (routine) without abnormal findings: Secondary | ICD-10-CM | POA: Diagnosis not present

## 2017-01-17 DIAGNOSIS — R202 Paresthesia of skin: Secondary | ICD-10-CM | POA: Diagnosis not present

## 2017-01-27 DIAGNOSIS — R202 Paresthesia of skin: Secondary | ICD-10-CM | POA: Diagnosis not present

## 2017-03-02 ENCOUNTER — Telehealth: Payer: Self-pay

## 2017-03-02 ENCOUNTER — Ambulatory Visit: Payer: BLUE CROSS/BLUE SHIELD | Admitting: Neurology

## 2017-03-02 NOTE — Telephone Encounter (Signed)
Pt called and cancelled same day appt due to getting lost.

## 2017-03-07 ENCOUNTER — Encounter: Payer: Self-pay | Admitting: Neurology

## 2017-03-11 DIAGNOSIS — J0100 Acute maxillary sinusitis, unspecified: Secondary | ICD-10-CM | POA: Diagnosis not present

## 2017-03-14 DIAGNOSIS — K099 Cyst of oral region, unspecified: Secondary | ICD-10-CM | POA: Diagnosis not present

## 2017-03-14 DIAGNOSIS — R42 Dizziness and giddiness: Secondary | ICD-10-CM | POA: Diagnosis not present

## 2017-03-14 DIAGNOSIS — Z13220 Encounter for screening for lipoid disorders: Secondary | ICD-10-CM | POA: Diagnosis not present

## 2017-03-17 DIAGNOSIS — D1039 Benign neoplasm of other parts of mouth: Secondary | ICD-10-CM | POA: Diagnosis not present

## 2017-03-31 DIAGNOSIS — K136 Irritative hyperplasia of oral mucosa: Secondary | ICD-10-CM | POA: Diagnosis not present

## 2017-04-05 DIAGNOSIS — K136 Irritative hyperplasia of oral mucosa: Secondary | ICD-10-CM | POA: Diagnosis not present

## 2018-01-11 DIAGNOSIS — R51 Headache: Secondary | ICD-10-CM | POA: Diagnosis not present

## 2018-01-11 DIAGNOSIS — R202 Paresthesia of skin: Secondary | ICD-10-CM | POA: Diagnosis not present

## 2018-03-20 ENCOUNTER — Ambulatory Visit: Payer: BLUE CROSS/BLUE SHIELD | Admitting: Neurology

## 2018-05-18 ENCOUNTER — Encounter

## 2018-05-18 ENCOUNTER — Ambulatory Visit: Payer: BLUE CROSS/BLUE SHIELD | Admitting: Neurology

## 2018-05-20 DIAGNOSIS — B351 Tinea unguium: Secondary | ICD-10-CM | POA: Diagnosis not present

## 2018-05-20 DIAGNOSIS — R21 Rash and other nonspecific skin eruption: Secondary | ICD-10-CM | POA: Diagnosis not present

## 2018-06-07 ENCOUNTER — Ambulatory Visit: Payer: BLUE CROSS/BLUE SHIELD | Admitting: Neurology

## 2018-06-07 ENCOUNTER — Telehealth: Payer: Self-pay | Admitting: Neurology

## 2018-06-07 ENCOUNTER — Encounter: Payer: Self-pay | Admitting: Neurology

## 2018-06-07 VITALS — BP 119/81 | HR 82 | Ht 63.5 in | Wt 159.8 lb

## 2018-06-07 DIAGNOSIS — R519 Headache, unspecified: Secondary | ICD-10-CM

## 2018-06-07 DIAGNOSIS — Z6828 Body mass index (BMI) 28.0-28.9, adult: Secondary | ICD-10-CM | POA: Diagnosis not present

## 2018-06-07 DIAGNOSIS — R51 Headache: Secondary | ICD-10-CM | POA: Diagnosis not present

## 2018-06-07 DIAGNOSIS — Z01419 Encounter for gynecological examination (general) (routine) without abnormal findings: Secondary | ICD-10-CM | POA: Diagnosis not present

## 2018-06-07 DIAGNOSIS — R202 Paresthesia of skin: Secondary | ICD-10-CM | POA: Insufficient documentation

## 2018-06-07 DIAGNOSIS — Z1231 Encounter for screening mammogram for malignant neoplasm of breast: Secondary | ICD-10-CM | POA: Diagnosis not present

## 2018-06-07 NOTE — Telephone Encounter (Signed)
lvm for pt to call back about scheduling mri   [employer] Auth: [id number] (exp. 06/07/18 to 07/06/18)

## 2018-06-07 NOTE — Progress Notes (Signed)
PATIENT: [name] DOB: July 27, 1971  Chief Complaint  Patient presents with  . Headache    Reports one episode of hives, lock jaw, numbness in left arm/leg (around 2012).  She has had two similar episodes of severe headache, dizziness, numbness in left arm/leg.  She has never been evaluated by neurology.   Marland Kitchen PCP    Jarrett Soho, PA-C     HISTORICAL  [name] is off 47 years old female, seen in request by her primary care PA Jarrett Soho, for evaluation of left side paresthesia, sudden onset headaches, initial evaluation was on June 07, 2018.  I have reviewed and summarized the referring note from the referring physician and also previous neurological evaluation by Dr. Sedonia Small in 2013.  In 2013, she had a sudden onset hives, broke out from head to toe, she was treated with prednisone, about a week after treatment, she had a sudden onset difficulty closing her mouth, left arm and left paresthesia and weakness, she was evaluated then, CT head without contrast was normal, reported normal EMG nerve conduction study.  Laboratory evaluation at that time showed elevated  cholesterol 235, LDL 155,   Since the initial event, she has intermittent left arm and left paresthesia, sensitive to touch sometimes, but there was no limitation, no gait abnormality, she has occasionally headaches,  In July 2019, she woke up from sleep with severe headaches, dizziness, unsteady sensation, severe headache last about 30 minutes, without lateralized motor or sensory deficit.  She has strong family history of stroke,   With her constellation of symptoms, she is concerned about intracranial pathology.   REVIEW OF SYSTEMS: Full 14 system review of systems performed and notable only for as above. All other review of systems were negative.  ALLERGIES: Allergies  Allergen Reactions  . Eggs Or Egg-Derived Products Swelling  . Latex Other (See Comments)    REACTION UNKNOWN     HOME MEDICATIONS: Current Outpatient Medications  Medication Sig Dispense Refill  . flintstones complete (FLINTSTONES) 60 MG chewable tablet Chew 2 tablets by mouth daily.      No current facility-administered medications for this visit.     PAST MEDICAL HISTORY: Past Medical History:  Diagnosis Date  . Complication of anesthesia    pt reports waking up during surgery  . Esophageal stricture   . Normal pregnancy 04/19/2011  . Postpartum care following cesarean delivery 04/19/2011    PAST SURGICAL HISTORY: Past Surgical History:  Procedure Laterality Date  . CESAREAN SECTION  04/19/2011   Procedure: CESAREAN SECTION;  Surgeon: Roselle Locus II;  Location: [facility];  Service: Gynecology;  Laterality: N/A;  Primary Ceserean section delivery of baby  boy apgar 7/8  cord ph  7.06 - Baby,mom,dad banded by Nevada Crane RN & Colin Mulders RN   . CESAREAN SECTION N/A 08/18/2015   Procedure: CESAREAN SECTION;  Surgeon: Mitchel Honour, DO;  Location: [facility];  Service: Obstetrics;  Laterality: N/A;  . ESOPHAGEAL DILATION      FAMILY HISTORY: Family History  Problem Relation Age of Onset  . Heart disease Mother   . Heart attack Mother        x 2  . Iron deficiency Mother   . Thyroid disease Father   . Hyperlipidemia Father   . Stroke Father        x 2  . Lung cancer Maternal Grandfather   . Stroke Paternal Grandmother   . Alzheimer's disease Paternal Grandmother   . Parkinson's  disease Paternal Grandfather   . Stroke Other     SOCIAL HISTORY: Social History   Socioeconomic History  . Marital status: Married    Spouse name: Not on file  . Number of children: 3  . Years of education: college  . Highest education level: Bachelor's degree (e.g., BA, AB, BS)  Occupational History  . Occupation: Runner, broadcasting/film/video  Social Needs  . Financial resource strain: Not on file  . Food insecurity:    Worry: Not on file    Inability: Not on file  . Transportation needs:    Medical: Not on file     Non-medical: Not on file  Tobacco Use  . Smoking status: Never Smoker  . Smokeless tobacco: Never Used  Substance and Sexual Activity  . Alcohol use: No  . Drug use: No  . Sexual activity: Yes    Birth control/protection: None  Lifestyle  . Physical activity:    Days per week: Not on file    Minutes per session: Not on file  . Stress: Not on file  Relationships  . Social connections:    Talks on phone: Not on file    Gets together: Not on file    Attends religious service: Not on file    Active member of club or organization: Not on file    Attends meetings of clubs or organizations: Not on file    Relationship status: Not on file  . Intimate partner violence:    Fear of current or ex partner: Not on file    Emotionally abused: Not on file    Physically abused: Not on file    Forced sexual activity: Not on file  Other Topics Concern  . Not on file  Social History Narrative   Lives at home with her husband.   Right-handed.   2 cups coffee and 2 sodas per day.     PHYSICAL EXAM   Vitals:   06/07/18 0942  BP: 119/81  Pulse: 82  Weight: 159 lb 12 oz (72.5 kg)  Height: 5' 3.5" (1.613 m)    Not recorded      Body mass index is 27.85 kg/m.  PHYSICAL EXAMNIATION:  Gen: NAD, conversant, well nourised, obese, well groomed                     Cardiovascular: Regular rate rhythm, no peripheral edema, warm, nontender. Eyes: Conjunctivae clear without exudates or hemorrhage Neck: Supple, no carotid bruits. Pulmonary: Clear to auscultation bilaterally   NEUROLOGICAL EXAM:  MENTAL STATUS: Speech:    Speech is normal; fluent and spontaneous with normal comprehension.  Cognition:     Orientation to time, place and person     Normal recent and remote memory     Normal Attention span and concentration     Normal Language, naming, repeating,spontaneous speech     Fund of knowledge   CRANIAL NERVES: CN II: Visual fields are full to confrontation. Fundoscopic exam  is normal with sharp discs and no vascular changes. Pupils are round equal and briskly reactive to light. CN III, IV, VI: extraocular movement are normal. No ptosis. CN V: Facial sensation is intact to pinprick in all 3 divisions bilaterally. Corneal responses are intact.  CN VII: Face is symmetric with normal eye closure and smile. CN VIII: Hearing is normal to rubbing fingers CN IX, X: Palate elevates symmetrically. Phonation is normal. CN XI: Head turning and shoulder shrug are intact CN XII: Tongue is midline with normal  movements and no atrophy.  MOTOR: There is no pronator drift of out-stretched arms. Muscle bulk and tone are normal. Muscle strength is normal.  REFLEXES: Reflexes are 2+ and symmetric at the biceps, triceps, knees, and ankles. Plantar responses are flexor.  SENSORY: Intact to light touch, pinprick, positional sensation and vibratory sensation are intact in fingers and toes.  COORDINATION: Rapid alternating movements and fine finger movements are intact. There is no dysmetria on finger-to-nose and heel-knee-shin.    GAIT/STANCE: Posture is normal. Gait is steady with normal steps, base, arm swing, and turning. Heel and toe walking are normal. Tandem gait is normal.  Romberg is absent.   DIAGNOSTIC DATA (LABS, IMAGING, TESTING) - I reviewed patient records, labs, notes, testing and imaging myself where available.   ASSESSMENT AND PLAN  [name] is a 47 y.o. female   Intermittent paresthesia involving left lateral arm and left leg, Sudden onset headaches  MRI brain to rule out right hemisphere pathology.  NSAIDs as need for headache.   Levert Feinstein, M.D. Ph.D.  [facility] [address], [address] [place], [place] [address] Ph: [phone] Fax: [phone]  CC: Referring Provider

## 2018-06-07 NOTE — Telephone Encounter (Signed)
Pt returning Emily's call  °

## 2018-06-08 NOTE — Telephone Encounter (Signed)
Spoke to the patient [facility]

## 2018-06-14 ENCOUNTER — Ambulatory Visit: Payer: BLUE CROSS/BLUE SHIELD

## 2018-06-14 DIAGNOSIS — R202 Paresthesia of skin: Secondary | ICD-10-CM | POA: Diagnosis not present

## 2018-07-06 DIAGNOSIS — M25562 Pain in left knee: Secondary | ICD-10-CM | POA: Diagnosis not present

## 2018-07-12 DIAGNOSIS — M25562 Pain in left knee: Secondary | ICD-10-CM | POA: Diagnosis not present

## 2018-07-13 ENCOUNTER — Telehealth: Payer: Self-pay | Admitting: Neurology

## 2018-07-13 NOTE — Telephone Encounter (Signed)
Spoke to patient - she is aware of her normal MRI results.  I let patient know that Dr. Terrace Arabia had sent her an email, with these results, on 06/16/18.  She was pleased to hear the scan was normal.  States she may have overlooked the email.

## 2018-07-13 NOTE — Telephone Encounter (Signed)
Pt is asking for a call with results of MRI when available

## 2018-09-23 DIAGNOSIS — H6692 Otitis media, unspecified, left ear: Secondary | ICD-10-CM | POA: Diagnosis not present

## 2018-11-06 DIAGNOSIS — H9209 Otalgia, unspecified ear: Secondary | ICD-10-CM | POA: Diagnosis not present

## 2018-11-06 DIAGNOSIS — J019 Acute sinusitis, unspecified: Secondary | ICD-10-CM | POA: Diagnosis not present

## 2020-01-28 DIAGNOSIS — Z01419 Encounter for gynecological examination (general) (routine) without abnormal findings: Secondary | ICD-10-CM | POA: Diagnosis not present

## 2020-01-28 DIAGNOSIS — Z1231 Encounter for screening mammogram for malignant neoplasm of breast: Secondary | ICD-10-CM | POA: Diagnosis not present

## 2020-01-28 DIAGNOSIS — Z6829 Body mass index (BMI) 29.0-29.9, adult: Secondary | ICD-10-CM | POA: Diagnosis not present

## 2020-03-06 DIAGNOSIS — Z13228 Encounter for screening for other metabolic disorders: Secondary | ICD-10-CM | POA: Diagnosis not present

## 2020-03-06 DIAGNOSIS — Z13220 Encounter for screening for lipoid disorders: Secondary | ICD-10-CM | POA: Diagnosis not present

## 2022-12-23 DIAGNOSIS — Z13220 Encounter for screening for lipoid disorders: Secondary | ICD-10-CM | POA: Diagnosis not present

## 2022-12-23 DIAGNOSIS — Z131 Encounter for screening for diabetes mellitus: Secondary | ICD-10-CM | POA: Diagnosis not present

## 2022-12-23 DIAGNOSIS — Z124 Encounter for screening for malignant neoplasm of cervix: Secondary | ICD-10-CM | POA: Diagnosis not present

## 2022-12-23 DIAGNOSIS — Z01419 Encounter for gynecological examination (general) (routine) without abnormal findings: Secondary | ICD-10-CM | POA: Diagnosis not present

## 2022-12-23 DIAGNOSIS — Z1151 Encounter for screening for human papillomavirus (HPV): Secondary | ICD-10-CM | POA: Diagnosis not present

## 2022-12-23 DIAGNOSIS — Z13228 Encounter for screening for other metabolic disorders: Secondary | ICD-10-CM | POA: Diagnosis not present

## 2022-12-23 DIAGNOSIS — Z6827 Body mass index (BMI) 27.0-27.9, adult: Secondary | ICD-10-CM | POA: Diagnosis not present

## 2022-12-23 DIAGNOSIS — Z1231 Encounter for screening mammogram for malignant neoplasm of breast: Secondary | ICD-10-CM | POA: Diagnosis not present

## 2023-04-06 DIAGNOSIS — N898 Other specified noninflammatory disorders of vagina: Secondary | ICD-10-CM | POA: Diagnosis not present

## 2023-04-06 DIAGNOSIS — A609 Anogenital herpesviral infection, unspecified: Secondary | ICD-10-CM | POA: Diagnosis not present

## 2023-09-19 DIAGNOSIS — L290 Pruritus ani: Secondary | ICD-10-CM | POA: Diagnosis not present

## 2023-10-28 ENCOUNTER — Emergency Department (HOSPITAL_COMMUNITY): Payer: BC Managed Care – PPO | Admitting: Anesthesiology

## 2023-10-28 ENCOUNTER — Other Ambulatory Visit: Payer: Self-pay

## 2023-10-28 ENCOUNTER — Ambulatory Visit (HOSPITAL_COMMUNITY)
Admission: EM | Admit: 2023-10-28 | Discharge: 2023-10-28 | Disposition: A | Discharge status: Home / Self Care | Payer: BC Managed Care – PPO | Attending: Emergency Medicine | Admitting: Emergency Medicine

## 2023-10-28 ENCOUNTER — Encounter (HOSPITAL_COMMUNITY)
Admission: EM | Disposition: A | Discharge status: Home / Self Care | Payer: Self-pay | Source: Home / Self Care | Attending: Emergency Medicine

## 2023-10-28 ENCOUNTER — Encounter (HOSPITAL_COMMUNITY): Payer: Self-pay

## 2023-10-28 DIAGNOSIS — W44F3XA Food entering into or through a natural orifice, initial encounter: Secondary | ICD-10-CM | POA: Insufficient documentation

## 2023-10-28 DIAGNOSIS — E039 Hypothyroidism, unspecified: Secondary | ICD-10-CM | POA: Insufficient documentation

## 2023-10-28 DIAGNOSIS — Z79899 Other long term (current) drug therapy: Secondary | ICD-10-CM | POA: Diagnosis not present

## 2023-10-28 DIAGNOSIS — Z7951 Long term (current) use of inhaled steroids: Secondary | ICD-10-CM | POA: Insufficient documentation

## 2023-10-28 DIAGNOSIS — K200 Eosinophilic esophagitis: Secondary | ICD-10-CM | POA: Diagnosis not present

## 2023-10-28 DIAGNOSIS — K222 Esophageal obstruction: Secondary | ICD-10-CM | POA: Insufficient documentation

## 2023-10-28 DIAGNOSIS — Z79624 Long term (current) use of inhibitors of nucleotide synthesis: Secondary | ICD-10-CM | POA: Diagnosis not present

## 2023-10-28 DIAGNOSIS — T18128A Food in esophagus causing other injury, initial encounter: Secondary | ICD-10-CM | POA: Insufficient documentation

## 2023-10-28 DIAGNOSIS — R519 Headache, unspecified: Secondary | ICD-10-CM | POA: Diagnosis not present

## 2023-10-28 DIAGNOSIS — T18108A Unspecified foreign body in esophagus causing other injury, initial encounter: Secondary | ICD-10-CM | POA: Diagnosis not present

## 2023-10-28 HISTORY — PX: FOREIGN BODY REMOVAL: SHX962

## 2023-10-28 HISTORY — PX: ESOPHAGOGASTRODUODENOSCOPY: SHX5428

## 2023-10-28 LAB — I-STAT ARTERIAL BLOOD GAS, ED
Acid-Base Excess: 4 mmol/L — ABNORMAL HIGH (ref 0.0–2.0)
Bicarbonate: 28.6 mmol/L — ABNORMAL HIGH (ref 20.0–28.0)
Calcium, Ion: 1.28 mmol/L (ref 1.15–1.40)
HCT: 35 % — ABNORMAL LOW (ref 36.0–46.0)
Hemoglobin: 11.9 g/dL — ABNORMAL LOW (ref 12.0–15.0)
O2 Saturation: 100 %
Patient temperature: 98.6
Potassium: 3.3 mmol/L — ABNORMAL LOW (ref 3.5–5.1)
Sodium: 141 mmol/L (ref 135–145)
TCO2: 30 mmol/L (ref 22–32)
pCO2 arterial: 42.4 mm[Hg] (ref 32–48)
pH, Arterial: 7.438 (ref 7.35–7.45)
pO2, Arterial: 463 mm[Hg] — ABNORMAL HIGH (ref 83–108)

## 2023-10-28 SURGERY — EGD (ESOPHAGOGASTRODUODENOSCOPY)
Anesthesia: General

## 2023-10-28 MED ORDER — FENTANYL CITRATE (PF) 250 MCG/5ML IJ SOLN
INTRAMUSCULAR | Status: DC | PRN
  Administered 2023-10-28: 100 ug via INTRAVENOUS

## 2023-10-28 MED ORDER — ROCURONIUM BROMIDE 10 MG/ML (PF) SYRINGE
PREFILLED_SYRINGE | INTRAVENOUS | Status: DC | PRN
  Administered 2023-10-28: 5 mg via INTRAVENOUS

## 2023-10-28 MED ORDER — GLUCAGON HCL RDNA (DIAGNOSTIC) 1 MG IJ SOLR
1.0000 mg | Freq: Once | INTRAMUSCULAR | Status: AC
  Administered 2023-10-28: 1 mg via INTRAVENOUS
  Filled 2023-10-28: qty 1

## 2023-10-28 MED ORDER — SUCCINYLCHOLINE CHLORIDE 200 MG/10ML IV SOSY
PREFILLED_SYRINGE | INTRAVENOUS | Status: DC | PRN
  Administered 2023-10-28: 160 mg via INTRAVENOUS

## 2023-10-28 MED ORDER — SODIUM CHLORIDE 0.9 % IV SOLN: INTRAVENOUS | Status: DC | PRN

## 2023-10-28 MED ORDER — FENTANYL CITRATE (PF) 100 MCG/2ML IJ SOLN
INTRAMUSCULAR | Status: AC
  Filled 2023-10-28: qty 2

## 2023-10-28 MED ORDER — LIDOCAINE 2% (20 MG/ML) 5 ML SYRINGE
INTRAMUSCULAR | Status: DC | PRN
  Administered 2023-10-28: 100 mg via INTRAVENOUS

## 2023-10-28 MED ORDER — DEXAMETHASONE SODIUM PHOSPHATE 10 MG/ML IJ SOLN
INTRAMUSCULAR | Status: DC | PRN
  Administered 2023-10-28: 10 mg via INTRAVENOUS

## 2023-10-28 MED ORDER — PROPOFOL 10 MG/ML IV BOLUS
INTRAVENOUS | Status: DC | PRN
  Administered 2023-10-28: 150 mg via INTRAVENOUS

## 2023-10-28 MED ORDER — ONDANSETRON HCL 4 MG/2ML IJ SOLN
INTRAMUSCULAR | Status: DC | PRN
  Administered 2023-10-28: 4 mg via INTRAVENOUS

## 2023-10-28 MED ORDER — ONDANSETRON HCL 4 MG/2ML IJ SOLN
4.0000 mg | Freq: Once | INTRAMUSCULAR | Status: DC | PRN
  Filled 2023-10-28: qty 2

## 2023-10-28 NOTE — ED Triage Notes (Signed)
Pt. Stated, After a couple bites of Snickers and then I couldn't swallow. I tried taking some liquid Benadryl but didn't get down. Im having to spit all salvia out.

## 2023-10-28 NOTE — Discharge Instructions (Signed)

## 2023-10-28 NOTE — Anesthesia Postprocedure Evaluation (Signed)
Anesthesia Post Note  Patient: [name]  Procedure(s) Performed: ESOPHAGOGASTRODUODENOSCOPY (EGD) FOREIGN BODY REMOVAL     Patient location during evaluation: PACU Anesthesia Type: General Level of consciousness: awake and alert and oriented Pain management: pain level controlled Vital Signs Assessment: post-procedure vital signs reviewed and stable Respiratory status: spontaneous breathing, nonlabored ventilation and respiratory function stable Cardiovascular status: blood pressure returned to baseline and stable Postop Assessment: no apparent nausea or vomiting Anesthetic complications: no   No notable events documented.  Last Vitals:  Vitals:   10/28/23 1600 10/28/23 1611  BP: 120/84 (!) 137/91  Pulse: 82 87  Resp: 14 14  Temp:    SpO2: 97% 99%    Last Pain:  Vitals:   10/28/23 1611  TempSrc:   PainSc: 0-No pain                 Nagi Furio A.

## 2023-10-28 NOTE — Anesthesia Preprocedure Evaluation (Addendum)
Anesthesia Evaluation  Patient identified by MRN, date of birth, ID band Patient awake    Reviewed: Allergy & Precautions, H&P , NPO status , Patient's Chart, lab work & pertinent test results  History of Anesthesia Complications Negative for: history of anesthetic complications  Airway Mallampati: II  TM Distance: >3 FB Neck ROM: Full    Dental  (+) Teeth Intact, Dental Advisory Given   Pulmonary neg pulmonary ROS   Pulmonary exam normal breath sounds clear to auscultation       Cardiovascular Exercise Tolerance: Good negative cardio ROS Normal cardiovascular exam Rhythm:Regular Rate:Normal     Neuro/Psych  Headaches  negative psych ROS   GI/Hepatic negative GI ROS, Neg liver ROS,,,  Endo/Other  Hypothyroidism    Renal/GU negative Renal ROS  negative genitourinary   Musculoskeletal   Abdominal   Peds  Hematology negative hematology ROS (+)   Anesthesia Other Findings   Reproductive/Obstetrics negative OB ROS                             Anesthesia Physical Anesthesia Plan  ASA: 2  Anesthesia Plan: General   Post-op Pain Management: Minimal or no pain anticipated   Induction: Intravenous and Cricoid pressure planned  PONV Risk Score and Plan: 3 and Ondansetron, Treatment may vary due to age or medical condition and Dexamethasone  Airway Management Planned: Oral ETT  Additional Equipment: None  Intra-op Plan:   Post-operative Plan: Extubation in OR  Informed Consent: I have reviewed the patients History and Physical, chart, labs and discussed the procedure including the risks, benefits and alternatives for the proposed anesthesia with the patient or authorized representative who has indicated his/her understanding and acceptance.     Dental advisory given  Plan Discussed with: CRNA, Surgeon and Anesthesiologist  Anesthesia Plan Comments: (  )        Anesthesia  Quick Evaluation

## 2023-10-28 NOTE — Transfer of Care (Signed)
Immediate Anesthesia Transfer of Care Note  Patient: [name]  Procedure(s) Performed: ESOPHAGOGASTRODUODENOSCOPY (EGD) FOREIGN BODY REMOVAL  Patient Location: PACU and Endoscopy Unit  Anesthesia Type:General  Level of Consciousness: awake, alert , and oriented  Airway & Oxygen Therapy: Patient Spontanous Breathing  Post-op Assessment: Report given to RN and Post -op Vital signs reviewed and stable  Post vital signs: Reviewed and stable  Last Vitals:  Vitals Value Taken Time  BP 138/97 10/28/23 1540  Temp    Pulse 102 10/28/23 1541  Resp 25 10/28/23 1541  SpO2 100 % 10/28/23 1541  Vitals shown include unfiled device data.  Last Pain:  Vitals:   10/28/23 1437  TempSrc: Temporal  PainSc: 0-No pain         Complications: No notable events documented.

## 2023-10-28 NOTE — ED Notes (Signed)
Pt is able to swallow saliva but unable to easily swallow water. She states it goes down but not easily or smoothly. Primary RN notified.

## 2023-10-28 NOTE — ED Provider Notes (Signed)
EMERGENCY DEPARTMENT AT [facility] Provider Note   CSN: [id number] Arrival date & time: 10/28/23  0732     History  Chief Complaint  Patient presents with   Allergic Reaction   swalloing difficult    [name] is a 53 y.o. female.  53 yo F with a chief complaint of inability to swallow.  Patient states that she was eating a Snickers bar last night with almonds and all of a sudden felt like something got stuck and then she was not able to swallow afterwards.  This happened about 8 PM last night.  She went to sleep and did well.  Woke up this morning and still was unable to swallow her saliva and has been regurgitating it shortly after swallowing.  Tried some Benadryl without improvement.  Denies vomiting denies diarrhea.  No rash.  No known history of allergy.    Does have a history of esophageal stricture.  Has had multiple dilations of the esophagus.  Has not had in some time and has had progressive worsening of her symptoms at home.  Typically gets seen in [place].   Allergic Reaction      Home Medications Prior to Admission medications   Medication Sig Start Date End Date Taking? Authorizing Provider  acyclovir (ZOVIRAX) 200 MG/5ML suspension Take 10 mLs by mouth 2 (two) times daily. 04/06/23  Yes [provider]  clotrimazole-betamethasone (LOTRISONE) cream Apply 1 Application topically. Use 1-2 times a day.   Yes [provider]  budesonide (PULMICORT) 0.5 MG/2ML nebulizer solution 8 mL (2 mg total) by Other route daily. Mix 4 respules (8ml) with Splenda (5-7 packets) or with honey (about 10ml) to make a syrup. Swallow. Don't eat or drink anything for 30 minutes after the dose. Patient not taking: Reported on 10/28/2023 07/08/23 07/07/24  [provider]  flintstones complete (FLINTSTONES) 60 MG chewable tablet Chew 2 tablets by mouth daily.     [provider]  omeprazole 20 MG TBDD disintegrating tablet Take  40 mg by mouth in the morning. Patient not taking: Reported on 10/28/2023 06/29/23   [provider]  valACYclovir (VALTREX) 500 MG tablet Take 500 mg by mouth 2 (two) times daily. Patient not taking: Reported on 10/28/2023    [provider]      Allergies    Egg-derived products, Bee pollen, and Latex    Review of Systems   Review of Systems  Physical Exam Updated Vital Signs BP 119/81   Pulse 71   Temp 97.9 F (36.6 C) (Temporal)   Resp 16   Ht 5\' 3"  (1.6 m)   Wt 71.7 kg   LMP 10/24/2023   SpO2 100%   BMI 27.99 kg/m  Physical Exam Vitals and nursing note reviewed.  Constitutional:      General: She is not in acute distress.    Appearance: She is well-developed. She is not diaphoretic.  HENT:     Head: Normocephalic and atraumatic.     Mouth/Throat:     Comments: Posterior oropharynx without's obvious erythema edema uvula is midline.  Able to rotate her head 45 degrees in either direction without discomfort. Eyes:     Pupils: Pupils are equal, round, and reactive to light.  Cardiovascular:     Rate and Rhythm: Normal rate and regular rhythm.     Heart sounds: No murmur heard.    No friction rub. No gallop.  Pulmonary:     Effort: Pulmonary effort is  normal.     Breath sounds: No wheezing or rales.  Abdominal:     General: There is no distension.     Palpations: Abdomen is soft.     Tenderness: There is no abdominal tenderness.  Musculoskeletal:        General: No tenderness.     Cervical back: Normal range of motion and neck supple.  Skin:    General: Skin is warm and dry.  Neurological:     Mental Status: She is alert and oriented to person, place, and time.  Psychiatric:        Behavior: Behavior normal.     ED Results / Procedures / Treatments   Labs (all labs ordered are listed, but only abnormal results are displayed) Labs Reviewed  I-STAT ARTERIAL BLOOD GAS, ED - Abnormal; Notable for the following components:      Result Value    pO2, Arterial 463 (*)    Bicarbonate 28.6 (*)    Acid-Base Excess 4.0 (*)    Potassium 3.3 (*)    HCT 35.0 (*)    Hemoglobin 11.9 (*)    All other components within normal limits    EKG None  Radiology No results found.  Procedures Procedures    Medications Ordered in ED Medications  ondansetron (ZOFRAN) injection 4 mg ( Intravenous MAR Hold 10/28/23 1428)  glucagon (human recombinant) (GLUCAGEN) injection 1 mg (1 mg Intravenous Given 10/28/23 1610)    ED Course/ Medical Decision Making/ A&P                                 Medical Decision Making Risk Prescription drug management.   53 yo F with a chief complaints of an inability to swallow.  By history this sounds like an esophageal food impaction.  Patient has history of the same.  I spoke with GI.  Taken to endoscopy  The patients results and plan were reviewed and discussed.   Any x-rays performed were independently reviewed by myself.   Differential diagnosis were considered with the presenting HPI.  Medications  ondansetron (ZOFRAN) injection 4 mg ( Intravenous MAR Hold 10/28/23 1428)  glucagon (human recombinant) (GLUCAGEN) injection 1 mg (1 mg Intravenous Given 10/28/23 0903)    Vitals:   10/28/23 1000 10/28/23 1030 10/28/23 1100 10/28/23 1437  BP: 123/80 (!) 134/93 121/78 119/81  Pulse: 69 82 71 71  Resp:    16  Temp:    97.9 F (36.6 C)  TempSrc:    Temporal  SpO2: 100% 100% 100% 100%  Weight:    71.7 kg  Height:    5\' 3"  (1.6 m)    Final diagnoses:  Esophageal obstruction due to food impaction          Final Clinical Impression(s) / ED Diagnoses Final diagnoses:  Esophageal obstruction due to food impaction    Rx / DC Orders ED Discharge Orders     None         Melene Plan, DO 10/28/23 1521

## 2023-10-28 NOTE — Consult Note (Signed)
[facility]astroenterology Consult  Referring Provider: No ref. provider found Primary Care Physician:  Jarrett Soho, PA-C Primary Gastroenterologist: Archie Patten primary care doctor  Reason for Consultation: Food impaction  SUBJECTIVE:   HPI: [name] is a 53 y.o. female with past medical history significant for eosinophilic esophagitis not on treatment for last 10 years, previously requiring EGD with dilatation (has not had procedure in greater than 10 years).  Noted having consumed Snickers with almonds yesterday evening at 2045 and subsequently had globus sensation in upper cervical esophagus.  She has dysphagia to solids on a daily basis though typically resolves with consuming liquids.  No dysphagia to liquids.  She had no improvement in globus sensation with use of liquids, presented to hospital on 10/28/2023 given persistence of globus sensation.  Unable to tolerate oral secretions.  Denies chest pain and shortness of breath.  Denied abdominal pain.  No constipation or diarrhea, no blood per rectum.  No PPI use, no budesonide use.  No prior abdominal surgeries apart from cesarean section.  No metal in body.  EGD 06/30/2010 3 [facility]-pathology of second portion of duodenum showed no significant pathologic abnormalities, gastric biopsy showed no significant pathologic abnormalities, distal esophagus biopsy showed 15 eosinophils per high-power field, proximal esophagus biopsy showed 50 eosinophils per high-powered field.  Past Medical History:  Diagnosis Date   Complication of anesthesia    pt reports waking up during surgery   Esophageal stricture    Normal pregnancy 04/19/2011   Postpartum care following cesarean delivery 04/19/2011   Past Surgical History:  Procedure Laterality Date   CESAREAN SECTION  04/19/2011   Procedure: CESAREAN SECTION;  Surgeon: Roselle Locus II;  Location: [facility] ORS;  Service: Gynecology;  Laterality: N/A;  Primary Ceserean section delivery of baby  boy apgar  7/8  cord ph  7.06 - Baby,mom,dad banded by Nevada Crane RN & Colin Mulders RN    CESAREAN SECTION N/A 08/18/2015   Procedure: CESAREAN SECTION;  Surgeon: Mitchel Honour, DO;  Location: [facility] ORS;  Service: Obstetrics;  Laterality: N/A;   ESOPHAGEAL DILATION     Prior to Admission medications   Medication Sig Start Date End Date Taking? Authorizing Provider  acyclovir (ZOVIRAX) 200 MG/5ML suspension Take 10 mLs by mouth 2 (two) times daily. 04/06/23   [provider]  budesonide (PULMICORT) 0.5 MG/2ML nebulizer solution 8 mL (2 mg total) by Other route daily. Mix 4 respules (8ml) with Splenda (5-7 packets) or with honey (about 10ml) to make a syrup. Swallow. Don't eat or drink anything for 30 minutes after the dose. 07/08/23 07/07/24  [provider]  clotrimazole-betamethasone (LOTRISONE) cream 1 Application.    [provider]  flintstones complete (FLINTSTONES) 60 MG chewable tablet Chew 2 tablets by mouth daily.     [provider]  omeprazole 20 MG TBDD disintegrating tablet Take 40 mg by mouth in the morning. 06/29/23   [provider]  valACYclovir (VALTREX) 500 MG tablet Take 500 mg by mouth 2 (two) times daily.    [provider]   Current Facility-Administered Medications  Medication Dose Route Frequency Provider Last Rate Last Admin   ondansetron (ZOFRAN) injection 4 mg  4 mg Intravenous Once PRN Melene Plan, DO       Current Outpatient Medications  Medication Sig Dispense Refill   acyclovir (ZOVIRAX) 200 MG/5ML suspension Take 10 mLs by mouth 2 (two) times daily.     budesonide (PULMICORT) 0.5 MG/2ML nebulizer solution 8 mL (2 mg total) by Other  route daily. Mix 4 respules (8ml) with Splenda (5-7 packets) or with honey (about 10ml) to make a syrup. Swallow. Don't eat or drink anything for 30 minutes after the dose.     clotrimazole-betamethasone (LOTRISONE) cream 1 Application.     flintstones complete (FLINTSTONES) 60 MG chewable tablet  Chew 2 tablets by mouth daily.      omeprazole 20 MG TBDD disintegrating tablet Take 40 mg by mouth in the morning.     valACYclovir (VALTREX) 500 MG tablet Take 500 mg by mouth 2 (two) times daily.     Allergies as of 10/28/2023 - Review Complete 10/28/2023  Allergen Reaction Noted   Egg-derived products Swelling and Anaphylaxis 04/19/2011   Latex Other (See Comments) and Itching 04/19/2011   Family History  Problem Relation Age of Onset   Heart disease Mother    Heart attack Mother        x 2   Iron deficiency Mother    Thyroid disease Father    Hyperlipidemia Father    Stroke Father        x 2   Lung cancer Maternal Grandfather    Stroke Paternal Grandmother    Alzheimer's disease Paternal Grandmother    Parkinson's disease Paternal Grandfather    Stroke Other    Social History   Socioeconomic History   Marital status: Married    Spouse name: Not on file   Number of children: 3   Years of education: college   Highest education level: Bachelor's degree (e.g., BA, AB, BS)  Occupational History   Occupation: Teacher  Tobacco Use   Smoking status: Never   Smokeless tobacco: Never  Vaping Use   Vaping status: Never Used  Substance and Sexual Activity   Alcohol use: No   Drug use: No   Sexual activity: Yes    Birth control/protection: None  Other Topics Concern   Not on file  Social History Narrative   Lives at home with her husband.   Right-handed.   2 cups coffee and 2 sodas per day.   Social Drivers of Corporate investment banker Strain: Not on file  Food Insecurity: Not on file  Transportation Needs: Not on file  Physical Activity: Not on file  Stress: Not on file  Social Connections: Not on file  Intimate Partner Violence: Not on file   Review of Systems:  Review of Systems  Respiratory:  Negative for shortness of breath.   Cardiovascular:  Negative for chest pain.  Gastrointestinal:  Negative for abdominal pain, blood in stool, constipation,  diarrhea, nausea and vomiting.    OBJECTIVE:   Temp:  [98.3 F (36.8 C)-98.7 F (37.1 C)] 98.3 F (36.8 C) (01/31 0829) Pulse Rate:  [70-85] 82 (01/31 0900) Resp:  [16-18] 18 (01/31 0829) BP: (123-138)/(77-89) 130/82 (01/31 0900) SpO2:  [100 %] 100 % (01/31 0900) Weight:  [71.7 kg] 71.7 kg (01/31 0743)   Physical Exam Cardiovascular:     Rate and Rhythm: Normal rate and regular rhythm.  Pulmonary:     Effort: No respiratory distress.     Breath sounds: Normal breath sounds.  Abdominal:     General: Bowel sounds are normal. There is no distension.     Palpations: Abdomen is soft.     Tenderness: There is no abdominal tenderness. There is no guarding.  Musculoskeletal:     Right lower leg: No edema.     Left lower leg: No edema.  Skin:    General: Skin is  warm and dry.  Neurological:     Mental Status: She is alert.     Labs: No results for input(s): "WBC", "HGB", "HCT", "PLT" in the last 72 hours. BMET No results for input(s): "NA", "K", "CL", "CO2", "GLUCOSE", "BUN", "CREATININE", "CALCIUM" in the last 72 hours. LFT No results for input(s): "PROT", "ALBUMIN", "AST", "ALT", "ALKPHOS", "BILITOT", "BILIDIR", "IBILI" in the last 72 hours. PT/INR No results for input(s): "LABPROT", "INR" in the last 72 hours.  Diagnostic imaging: No results found.  IMPRESSION: Food impaction, inability to tolerate oral secretions History eosinophilic esophagitis, no treatment > 10 years Chronic solid food dysphagia secondary to above  PLAN: -Recommend EGD for food impaction, discussed procedure with patient including benefits, alternatives and risks of bleeding/infection/perforation/anesthesia, she verbalized understanding and elected to proceed -Recommend intubation for EGD given food impaction in upper esophagus -Encouraged patient to follow up with [facility]-GI team shortly after discharge from current hospitalization, will recommend she be on oral PPI therapy twice per day until can be  seen by [facility]-GI and modify diet to pureed foods -Further recommendations to follow pending procedure   LOS: 0 days   Liliane Shi, DO [facility]astroenter[facility]

## 2023-10-28 NOTE — Op Note (Signed)
[facility] Patient Name: [name] Procedure Date : 10/28/2023 MRN: [id number] Attending MD: Liliane Shi DO, DO, [id number] Date of Birth: 01-13-1971 CSN: [id number] Age: 53 Admit Type: Inpatient Procedure:                Upper GI endoscopy Indications:              Removal of foreign body in the esophagus,                            Eosinophilic esophagitis Providers:                Liliane Shi DO, DO, Stephens Shire RN, RN, Harrington Challenger, Technician Referring MD:              Medicines:                See the Anesthesia note for documentation of the                            administered medications Complications:            No immediate complications. Estimated Blood Loss:     Estimated blood loss: none. Procedure:                Pre-Anesthesia Assessment:                           - ASA Grade Assessment: II - A patient with mild                            systemic disease.                           - The risks and benefits of the procedure and the                            sedation options and risks were discussed with the                            patient. All questions were answered and informed                            consent was obtained.                           After obtaining informed consent, the endoscope was                            passed under direct vision. Throughout the                            procedure, the patient's blood pressure, pulse, and                            oxygen saturations were monitored continuously. The  [id number]-[id number] ([id number]) Olympus endoscope was introduced                            through the mouth, with the intention of advancing                            to the duodenum. The scope was advanced to the                            upper third of the esophagus before the procedure                            was aborted. Medications were given. The [id number]-[id number]                             ([id number]) Olympus slim endoscope was introduced                            through the and advanced to the. The upper GI                            endoscopy was accomplished without difficulty. The                            patient tolerated the procedure well. Scope In: Scope Out: Findings:      One benign-appearing, intrinsic severe (stenosis; an endoscope cannot       pass) stenosis was found in the upper third of the esophagus. This       stenosis measured 3 mm (inner diameter) x unknown length as the stenosis       was not traversed.      Food (suspected small piece of almond based on patient history) was       found in the upper third of the esophagus. Rat tooth forceps was       advanced through the endoscope working channel, rat tooth forceps were       opened and food was advanced into/through esopahgeal stenosis. Attempt       was made to traverse stenosis with ultra slim endoscope (5.4 mm       diameter), this was not successful and procedure was then aborted.      There was minimal blood loss noted after attempting to traverse the       esophageal stricture. Impression:               - Benign-appearing esophageal stenosis.                           - Food in the upper third of the esophagus.                           - No specimens collected. Moderate Sedation:      See the other procedure note for documentation of moderate sedation with       intraservice time. Recommendation:           - Discharge patient to home.                           -  Pureed diet indefinitely until can be seen and                            evaluated by her primary GI physician [facility]).                           - Continue present medications.                           - Can consider starting oral budesonide solution                            (as doubtful that PPI tablet would pass through                            stricture at current size), though suspect                             intrinsic stenosis is fibrotic given her                            long-standing history of untreated (>10 years)                            history of eosinophilic esophagitis. Will defer                            medical treatment of eosinophilic esophagitis to                            her primary GI physician [facility]). Procedure Code(s):        --- Professional ---                           [phone], 52, Esophagogastroduodenoscopy, flexible,                            transoral; diagnostic, including collection of                            specimen(s) by brushing or washing, when performed                            (separate procedure) Diagnosis Code(s):        --- Professional ---                           K22.2, Esophageal obstruction                           U04.540J, Food in esophagus causing other injury,                            initial encounter  T18.108A, Unspecified foreign body in esophagus                            causing other injury, initial encounter                           K20.0, Eosinophilic esophagitis CPT copyright 2022 American Medical Association. All rights reserved. The codes documented in this report are preliminary and upon coder review may  be revised to meet current compliance requirements. Dr Liliane Shi, DO Liliane Shi DO, DO 10/28/2023 3:51:53 PM Number of Addenda: 0

## 2023-10-28 NOTE — Interval H&P Note (Signed)
History and Physical Interval Note:  10/28/2023 3:04 PM  [name]  has presented today for surgery, with the diagnosis of Food impaction, history eosinophilic esophagitis.  The various methods of treatment have been discussed with the patient and family. After consideration of risks, benefits and other options for treatment, the patient has consented to  Procedure(s): ESOPHAGOGASTRODUODENOSCOPY (EGD) (N/A) as a surgical intervention.  The patient's history has been reviewed, patient examined, no change in status, stable for surgery.  I have reviewed the patient's chart and labs.  Questions were answered to the patient's satisfaction.     Lynann Bologna

## 2023-10-28 NOTE — H&P (View-Only) (Signed)
[facility] Consult  Referring Provider: No ref. provider found Primary Care Physician:  Jarrett Soho, PA-C Primary Gastroenterologist: Archie Patten primary care doctor  Reason for Consultation: Food impaction  SUBJECTIVE:   HPI: [name] is a 53 y.o. female with past medical history significant for eosinophilic esophagitis not on treatment for last 10 years, previously requiring EGD with dilatation (has not had procedure in greater than 10 years).  Noted having consumed Snickers with almonds yesterday evening at 2045 and subsequently had globus sensation in upper cervical esophagus.  She has dysphagia to solids on a daily basis though typically resolves with consuming liquids.  No dysphagia to liquids.  She had no improvement in globus sensation with use of liquids, presented to hospital on 10/28/2023 given persistence of globus sensation.  Unable to tolerate oral secretions.  Denies chest pain and shortness of breath.  Denied abdominal pain.  No constipation or diarrhea, no blood per rectum.  No PPI use, no budesonide use.  No prior abdominal surgeries apart from cesarean section.  No metal in body.  EGD 06/30/2010 3 [facility] of second portion of duodenum showed no significant pathologic abnormalities, gastric biopsy showed no significant pathologic abnormalities, distal esophagus biopsy showed 15 eosinophils per high-power field, proximal esophagus biopsy showed 50 eosinophils per high-powered field.  Past Medical History:  Diagnosis Date   Complication of anesthesia    pt reports waking up during surgery   Esophageal stricture    Normal pregnancy 04/19/2011   Postpartum care following cesarean delivery 04/19/2011   Past Surgical History:  Procedure Laterality Date   CESAREAN SECTION  04/19/2011   Procedure: CESAREAN SECTION;  Surgeon: Roselle Locus II;  Location: [facility];  Service: Gynecology;  Laterality: N/A;  Primary Ceserean section delivery of baby  boy apgar  7/8  cord ph  7.06 - Baby,mom,dad banded by Nevada Crane RN & Colin Mulders RN    CESAREAN SECTION N/A 08/18/2015   Procedure: CESAREAN SECTION;  Surgeon: Mitchel Honour, DO;  Location: [facility];  Service: Obstetrics;  Laterality: N/A;   ESOPHAGEAL DILATION     Prior to Admission medications   Medication Sig Start Date End Date Taking? Authorizing Provider  acyclovir (ZOVIRAX) 200 MG/5ML suspension Take 10 mLs by mouth 2 (two) times daily. 04/06/23   [provider]  budesonide (PULMICORT) 0.5 MG/2ML nebulizer solution 8 mL (2 mg total) by Other route daily. Mix 4 respules (8ml) with Splenda (5-7 packets) or with honey (about 10ml) to make a syrup. Swallow. Don't eat or drink anything for 30 minutes after the dose. 07/08/23 07/07/24  [provider]  clotrimazole-betamethasone (LOTRISONE) cream 1 Application.    [provider]  flintstones complete (FLINTSTONES) 60 MG chewable tablet Chew 2 tablets by mouth daily.     [provider]  omeprazole 20 MG TBDD disintegrating tablet Take 40 mg by mouth in the morning. 06/29/23   [provider]  valACYclovir (VALTREX) 500 MG tablet Take 500 mg by mouth 2 (two) times daily.    [provider]   Current Facility-Administered Medications  Medication Dose Route Frequency Provider Last Rate Last Admin   ondansetron (ZOFRAN) injection 4 mg  4 mg Intravenous Once PRN Melene Plan, DO       Current Outpatient Medications  Medication Sig Dispense Refill   acyclovir (ZOVIRAX) 200 MG/5ML suspension Take 10 mLs by mouth 2 (two) times daily.     budesonide (PULMICORT) 0.5 MG/2ML nebulizer solution 8 mL (2 mg total) by Other  route daily. Mix 4 respules (8ml) with Splenda (5-7 packets) or with honey (about 10ml) to make a syrup. Swallow. Don't eat or drink anything for 30 minutes after the dose.     clotrimazole-betamethasone (LOTRISONE) cream 1 Application.     flintstones complete (FLINTSTONES) 60 MG chewable tablet  Chew 2 tablets by mouth daily.      omeprazole 20 MG TBDD disintegrating tablet Take 40 mg by mouth in the morning.     valACYclovir (VALTREX) 500 MG tablet Take 500 mg by mouth 2 (two) times daily.     Allergies as of 10/28/2023 - Review Complete 10/28/2023  Allergen Reaction Noted   Egg-derived products Swelling and Anaphylaxis 04/19/2011   Latex Other (See Comments) and Itching 04/19/2011   Family History  Problem Relation Age of Onset   Heart disease Mother    Heart attack Mother        x 2   Iron deficiency Mother    Thyroid disease Father    Hyperlipidemia Father    Stroke Father        x 2   Lung cancer Maternal Grandfather    Stroke Paternal Grandmother    Alzheimer's disease Paternal Grandmother    Parkinson's disease Paternal Grandfather    Stroke Other    Social History   Socioeconomic History   Marital status: Married    Spouse name: Not on file   Number of children: 3   Years of education: college   Highest education level: Bachelor's degree (e.g., BA, AB, BS)  Occupational History   Occupation: Teacher  Tobacco Use   Smoking status: Never   Smokeless tobacco: Never  Vaping Use   Vaping status: Never Used  Substance and Sexual Activity   Alcohol use: No   Drug use: No   Sexual activity: Yes    Birth control/protection: None  Other Topics Concern   Not on file  Social History Narrative   Lives at home with her husband.   Right-handed.   2 cups coffee and 2 sodas per day.   Social Drivers of Corporate investment banker Strain: Not on file  Food Insecurity: Not on file  Transportation Needs: Not on file  Physical Activity: Not on file  Stress: Not on file  Social Connections: Not on file  Intimate Partner Violence: Not on file   Review of Systems:  Review of Systems  Respiratory:  Negative for shortness of breath.   Cardiovascular:  Negative for chest pain.  Gastrointestinal:  Negative for abdominal pain, blood in stool, constipation,  diarrhea, nausea and vomiting.    OBJECTIVE:   Temp:  [98.3 F (36.8 C)-98.7 F (37.1 C)] 98.3 F (36.8 C) (01/31 0829) Pulse Rate:  [70-85] 82 (01/31 0900) Resp:  [16-18] 18 (01/31 0829) BP: (123-138)/(77-89) 130/82 (01/31 0900) SpO2:  [100 %] 100 % (01/31 0900) Weight:  [71.7 kg] 71.7 kg (01/31 0743)   Physical Exam Cardiovascular:     Rate and Rhythm: Normal rate and regular rhythm.  Pulmonary:     Effort: No respiratory distress.     Breath sounds: Normal breath sounds.  Abdominal:     General: Bowel sounds are normal. There is no distension.     Palpations: Abdomen is soft.     Tenderness: There is no abdominal tenderness. There is no guarding.  Musculoskeletal:     Right lower leg: No edema.     Left lower leg: No edema.  Skin:    General: Skin is  warm and dry.  Neurological:     Mental Status: She is alert.     Labs: No results for input(s): "WBC", "HGB", "HCT", "PLT" in the last 72 hours. BMET No results for input(s): "NA", "K", "CL", "CO2", "GLUCOSE", "BUN", "CREATININE", "CALCIUM" in the last 72 hours. LFT No results for input(s): "PROT", "ALBUMIN", "AST", "ALT", "ALKPHOS", "BILITOT", "BILIDIR", "IBILI" in the last 72 hours. PT/INR No results for input(s): "LABPROT", "INR" in the last 72 hours.  Diagnostic imaging: No results found.  IMPRESSION: Food impaction, inability to tolerate oral secretions History eosinophilic esophagitis, no treatment > 10 years Chronic solid food dysphagia secondary to above  PLAN: -Recommend EGD for food impaction, discussed procedure with patient including benefits, alternatives and risks of bleeding/infection/perforation/anesthesia, she verbalized understanding and elected to proceed -Recommend intubation for EGD given food impaction in upper esophagus -Encouraged patient to follow up with [facility]GI team shortly after discharge from current hospitalization, will recommend she be on oral PPI therapy twice per day until can be  seen by [facility]-GI and modify diet to pureed foods -Further recommendations to follow pending procedure   LOS: 0 days   Liliane Shi, DO [facility]

## 2023-10-28 NOTE — Anesthesia Procedure Notes (Signed)
Procedure Name: Intubation Date/Time: 10/28/2023 3:15 PM  Performed by: Jimmey Ralph, CRNAPre-anesthesia Checklist: Patient identified, Emergency Drugs available, Suction available and Patient being monitored Patient Re-evaluated:Patient Re-evaluated prior to induction Oxygen Delivery Method: Circle system utilized Preoxygenation: Pre-oxygenation with 100% oxygen Induction Type: IV induction Laryngoscope Size: Mac and 3 Grade View: Grade II Tube type: Oral Tube size: 7.0 mm Number of attempts: 1 Airway Equipment and Method: Stylet Placement Confirmation: ETT inserted through vocal cords under direct vision, positive ETCO2 and breath sounds checked- equal and bilateral Secured at: 21 cm Tube secured with: Tape Dental Injury: Teeth and Oropharynx as per pre-operative assessment  Comments: PT HAS VERY SMALL MOUTH; USED THE PEDIATRIC BITE BLOCK IN EGD

## 2023-11-24 DIAGNOSIS — R051 Acute cough: Secondary | ICD-10-CM | POA: Diagnosis not present

## 2023-11-24 DIAGNOSIS — G501 Atypical facial pain: Secondary | ICD-10-CM | POA: Diagnosis not present

## 2023-11-24 DIAGNOSIS — R0981 Nasal congestion: Secondary | ICD-10-CM | POA: Diagnosis not present

## 2023-11-24 DIAGNOSIS — J019 Acute sinusitis, unspecified: Secondary | ICD-10-CM | POA: Diagnosis not present

## 2024-07-06 DIAGNOSIS — M25561 Pain in right knee: Secondary | ICD-10-CM | POA: Diagnosis not present

## 2024-07-16 DIAGNOSIS — Z131 Encounter for screening for diabetes mellitus: Secondary | ICD-10-CM | POA: Diagnosis not present

## 2024-07-16 DIAGNOSIS — Z1151 Encounter for screening for human papillomavirus (HPV): Secondary | ICD-10-CM | POA: Diagnosis not present

## 2024-07-16 DIAGNOSIS — Z13228 Encounter for screening for other metabolic disorders: Secondary | ICD-10-CM | POA: Diagnosis not present

## 2024-07-16 DIAGNOSIS — Z13220 Encounter for screening for lipoid disorders: Secondary | ICD-10-CM | POA: Diagnosis not present

## 2024-07-16 DIAGNOSIS — Z124 Encounter for screening for malignant neoplasm of cervix: Secondary | ICD-10-CM | POA: Diagnosis not present

## 2024-07-17 DIAGNOSIS — M25561 Pain in right knee: Secondary | ICD-10-CM | POA: Diagnosis not present

## 2024-07-26 DIAGNOSIS — M25562 Pain in left knee: Secondary | ICD-10-CM | POA: Diagnosis not present

## 2024-07-31 DIAGNOSIS — M25562 Pain in left knee: Secondary | ICD-10-CM | POA: Diagnosis not present

## 2024-07-31 DIAGNOSIS — M25561 Pain in right knee: Secondary | ICD-10-CM | POA: Diagnosis not present

## 2024-07-31 DIAGNOSIS — K200 Eosinophilic esophagitis: Secondary | ICD-10-CM | POA: Diagnosis not present

## 2024-07-31 DIAGNOSIS — K222 Esophageal obstruction: Secondary | ICD-10-CM | POA: Diagnosis not present

## 2024-08-02 DIAGNOSIS — M25561 Pain in right knee: Secondary | ICD-10-CM | POA: Diagnosis not present

## 2024-08-02 DIAGNOSIS — M25562 Pain in left knee: Secondary | ICD-10-CM | POA: Diagnosis not present

## 2024-08-15 DIAGNOSIS — M1711 Unilateral primary osteoarthritis, right knee: Secondary | ICD-10-CM | POA: Diagnosis not present

## 2024-08-15 DIAGNOSIS — M25561 Pain in right knee: Secondary | ICD-10-CM | POA: Diagnosis not present

## 2024-09-03 DIAGNOSIS — Z1211 Encounter for screening for malignant neoplasm of colon: Secondary | ICD-10-CM | POA: Diagnosis not present
